# Patient Record
Sex: Male | Born: 1965 | Race: Black or African American | Hispanic: No | Marital: Married | Smoking: Never smoker
Health system: Southern US, Community
[De-identification: ages and names within clinical notes are randomized; demographics above are authoritative.]

## PROBLEM LIST (undated history)

## (undated) DIAGNOSIS — I10 Essential (primary) hypertension: Secondary | ICD-10-CM

## (undated) DIAGNOSIS — E119 Type 2 diabetes mellitus without complications: Secondary | ICD-10-CM

## (undated) DIAGNOSIS — E7800 Pure hypercholesterolemia, unspecified: Secondary | ICD-10-CM

---

## 2012-07-21 ENCOUNTER — Emergency Department (HOSPITAL_BASED_OUTPATIENT_CLINIC_OR_DEPARTMENT_OTHER)
Admission: EM | Admit: 2012-07-21 | Discharge: 2012-07-21 | Disposition: A | Discharge status: Home / Self Care | Payer: Self-pay | Attending: Emergency Medicine | Admitting: Emergency Medicine

## 2012-07-21 ENCOUNTER — Encounter (HOSPITAL_BASED_OUTPATIENT_CLINIC_OR_DEPARTMENT_OTHER): Payer: Self-pay | Admitting: Family Medicine

## 2012-07-21 ENCOUNTER — Emergency Department (HOSPITAL_BASED_OUTPATIENT_CLINIC_OR_DEPARTMENT_OTHER): Payer: Self-pay

## 2012-07-21 DIAGNOSIS — R0789 Other chest pain: Secondary | ICD-10-CM | POA: Insufficient documentation

## 2012-07-21 DIAGNOSIS — M549 Dorsalgia, unspecified: Secondary | ICD-10-CM | POA: Insufficient documentation

## 2012-07-21 DIAGNOSIS — E1169 Type 2 diabetes mellitus with other specified complication: Secondary | ICD-10-CM | POA: Insufficient documentation

## 2012-07-21 DIAGNOSIS — M25519 Pain in unspecified shoulder: Secondary | ICD-10-CM | POA: Insufficient documentation

## 2012-07-21 DIAGNOSIS — R079 Chest pain, unspecified: Secondary | ICD-10-CM

## 2012-07-21 DIAGNOSIS — R739 Hyperglycemia, unspecified: Secondary | ICD-10-CM

## 2012-07-21 DIAGNOSIS — I10 Essential (primary) hypertension: Secondary | ICD-10-CM | POA: Insufficient documentation

## 2012-07-21 DIAGNOSIS — E7800 Pure hypercholesterolemia, unspecified: Secondary | ICD-10-CM | POA: Insufficient documentation

## 2012-07-21 HISTORY — DX: Pure hypercholesterolemia, unspecified: E78.00

## 2012-07-21 HISTORY — DX: Type 2 diabetes mellitus without complications: E11.9

## 2012-07-21 HISTORY — DX: Essential (primary) hypertension: I10

## 2012-07-21 LAB — CBC WITH DIFFERENTIAL/PLATELET
Basophils Relative: 0 % (ref 0–1)
Eosinophils Absolute: 0.1 10*3/uL (ref 0.0–0.7)
HCT: 41.4 % (ref 39.0–52.0)
Hemoglobin: 14.6 g/dL (ref 13.0–17.0)
Lymphs Abs: 1.6 10*3/uL (ref 0.7–4.0)
MCH: 31.3 pg (ref 26.0–34.0)
MCHC: 35.3 g/dL (ref 30.0–36.0)
Monocytes Absolute: 0.5 10*3/uL (ref 0.1–1.0)
Monocytes Relative: 8 % (ref 3–12)
Neutro Abs: 4 10*3/uL (ref 1.7–7.7)

## 2012-07-21 LAB — BASIC METABOLIC PANEL
BUN: 12 mg/dL (ref 6–23)
Chloride: 100 mEq/L (ref 96–112)
Creatinine, Ser: 1.1 mg/dL (ref 0.50–1.35)
GFR calc Af Amer: >90 mL/min (ref >90)
Glucose, Bld: 190 mg/dL — ABNORMAL HIGH (ref 70–99)

## 2012-07-21 LAB — TROPONIN I: Troponin I: <0.3 ng/mL (ref <0.30)

## 2012-07-21 MED ORDER — AMLODIPINE BESYLATE 10 MG PO TABS: 5.0000 mg | ORAL_TABLET | Freq: Every day | ORAL | Status: DC

## 2012-07-21 MED ORDER — LISINOPRIL 40 MG PO TABS: 40.0000 mg | ORAL_TABLET | Freq: Every day | ORAL | Status: DC

## 2012-07-21 MED ORDER — METFORMIN HCL 500 MG PO TABS: 500.0000 mg | ORAL_TABLET | Freq: Two times a day (BID) | ORAL | Status: DC

## 2012-07-21 NOTE — ED Notes (Signed)
Pt c/o left chest and left upper back pain x 6 days. Pt diaphoretic upon arrival.

## 2012-07-21 NOTE — ED Provider Notes (Signed)
History     CSN: [id number]  Arrival date & time 07/21/12  1013   First MD Initiated Contact with Patient 07/21/12 1046      Chief Complaint  Patient presents with  . Chest Pain    (Consider location/radiation/quality/duration/timing/severity/associated sxs/prior treatment) HPI Pt reports for the last 6 days he has had aching pain in L shoulder and upper chest, worse with moving arm and lifting things. He has also had an occasional aching pain in L upper back, worse with cough and deep breath. No SOB, pain comes and goes. No prior history of same. He has history of HTN, DM and stopped taking his medications when he ran out a few months ago. Has not been able to get back in to see his doctor due to financial issues.   Past Medical History  Diagnosis Date  . Hypertension   . Diabetes mellitus without complication   . High cholesterol     History reviewed. No pertinent past surgical history.  No family history on file.  History  Substance Use Topics  . Smoking status: Never Smoker   . Smokeless tobacco: Not on file  . Alcohol Use: Yes      Review of Systems All other systems reviewed and are negative except as noted in HPI.   Allergies  Review of patient's allergies indicates no known allergies.  Home Medications  No current outpatient prescriptions on file.  BP 204/112  Pulse 74  Temp 98.4 F (36.9 C) (Oral)  Resp 16  Ht 5\' 6"  (1.676 m)  Wt 188 lb (85.276 kg)  BMI 30.34 kg/m2  SpO2 100%  Physical Exam  Nursing note and vitals reviewed. Constitutional: He is oriented to person, place, and time. He appears well-developed and well-nourished.  HENT:  Head: Normocephalic and atraumatic.  Eyes: EOM are normal. Pupils are equal, round, and reactive to light.  Neck: Normal range of motion. Neck supple.  Cardiovascular: Normal rate, normal heart sounds and intact distal pulses.   Pulmonary/Chest: Effort normal and breath sounds normal. He exhibits tenderness.    Abdominal: Bowel sounds are normal. He exhibits no distension. There is no tenderness.  Musculoskeletal: Normal range of motion. He exhibits no edema and no tenderness.       Arms: Neurological: He is alert and oriented to person, place, and time. He has normal strength. No cranial nerve deficit or sensory deficit.  Skin: Skin is warm and dry. No rash noted.  Psychiatric: He has a normal mood and affect.    ED Course  Procedures (including critical care time)  Labs Reviewed  BASIC METABOLIC PANEL - Abnormal; Notable for the following:    Glucose, Bld 190 (*)     GFR calc non Af Amer 79 (*)     All other components within normal limits  CK TOTAL AND CKMB - Abnormal; Notable for the following:    Total CK 307 (*)     CK, MB 4.3 (*)     All other components within normal limits  CBC WITH DIFFERENTIAL  TROPONIN I   Dg Chest 2 View  07/21/2012  *RADIOLOGY REPORT*  Clinical Data: Left-sided chest pain.  Hypertension and diabetes.  CHEST - 2 VIEW  Comparison: None.  Findings: Artifact overlies chest.  Heart size is normal but with left ventricular prominence.  Mediastinal shadows are normal. There are areas of atelectasis in both lower lungs, left worse than right.  No effusions.  No bony abnormalities.  IMPRESSION: Areas of atelectasis in  both lower lungs, left worse than right.   Original Report Authenticated By: Paulina Fusi, M.D.      No diagnosis found.    MDM   Date: 07/21/2012  Rate: 78  Rhythm: normal sinus rhythm  QRS Axis: normal  Intervals: normal  ST/T Wave abnormalities: normal  Conduction Disutrbances: none  Narrative Interpretation: unremarkable  Labs and imaging unremarkable as above. Symptoms are musculoskeletal, mild hyperglycemia and marked HTN. Will restart 2 BP meds now as patient states he has lost some weight recently, was previously taking 4 different meds for HTN. Was also previously on insulin but glucose here is under 200 so will use Metformin as this is  less expensive. Advised PCP followup for recheck. No concern for ACS/MI/Dissection or PE.          Charles B. Bernette Mayers, MD 07/21/12 1225

## 2013-05-24 IMAGING — CR DG CHEST 2V
2 series · 2 of 2 positions shown · non-contrast
Comparison: None.

CLINICAL DATA: Left-sided chest pain.  Hypertension and diabetes.

CHEST - 2 VIEW

[w chest pa]
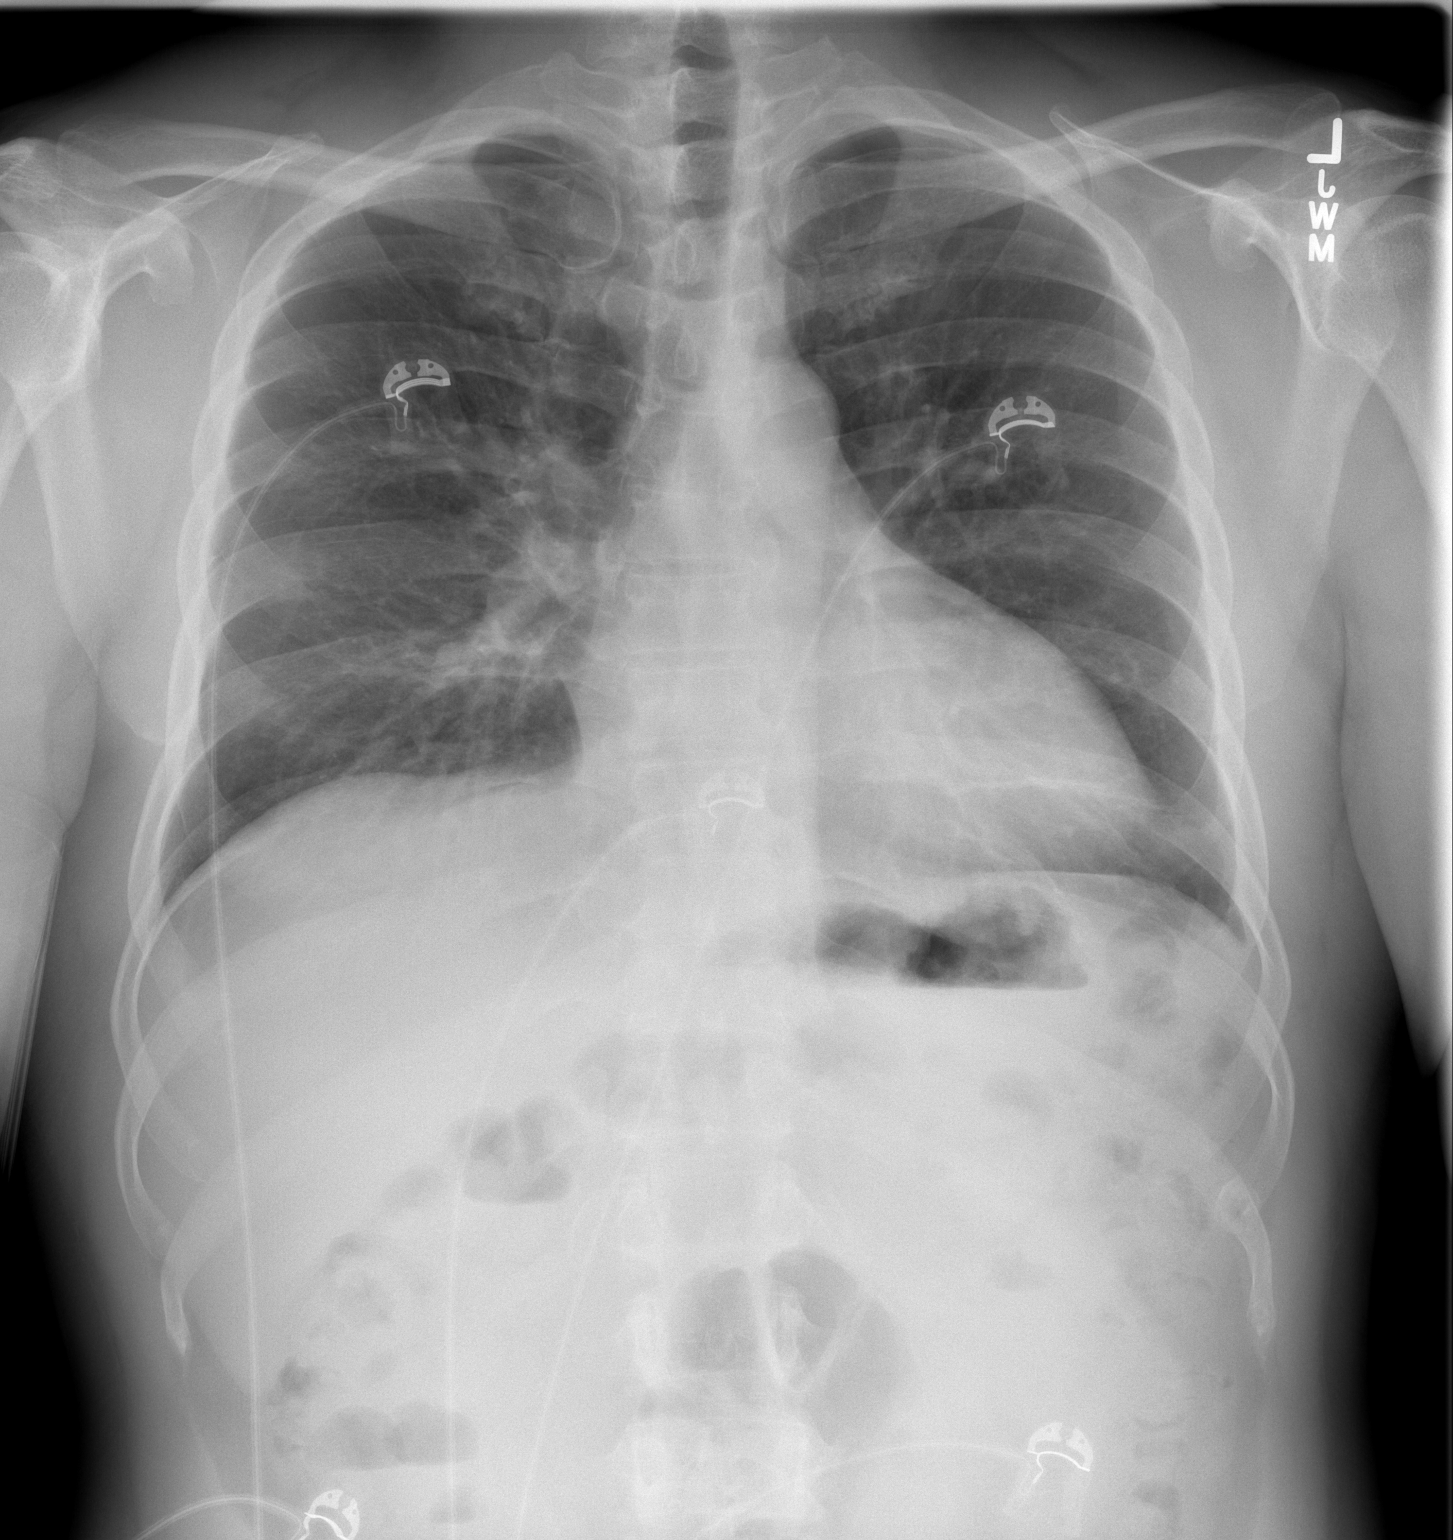

[w chest lat]
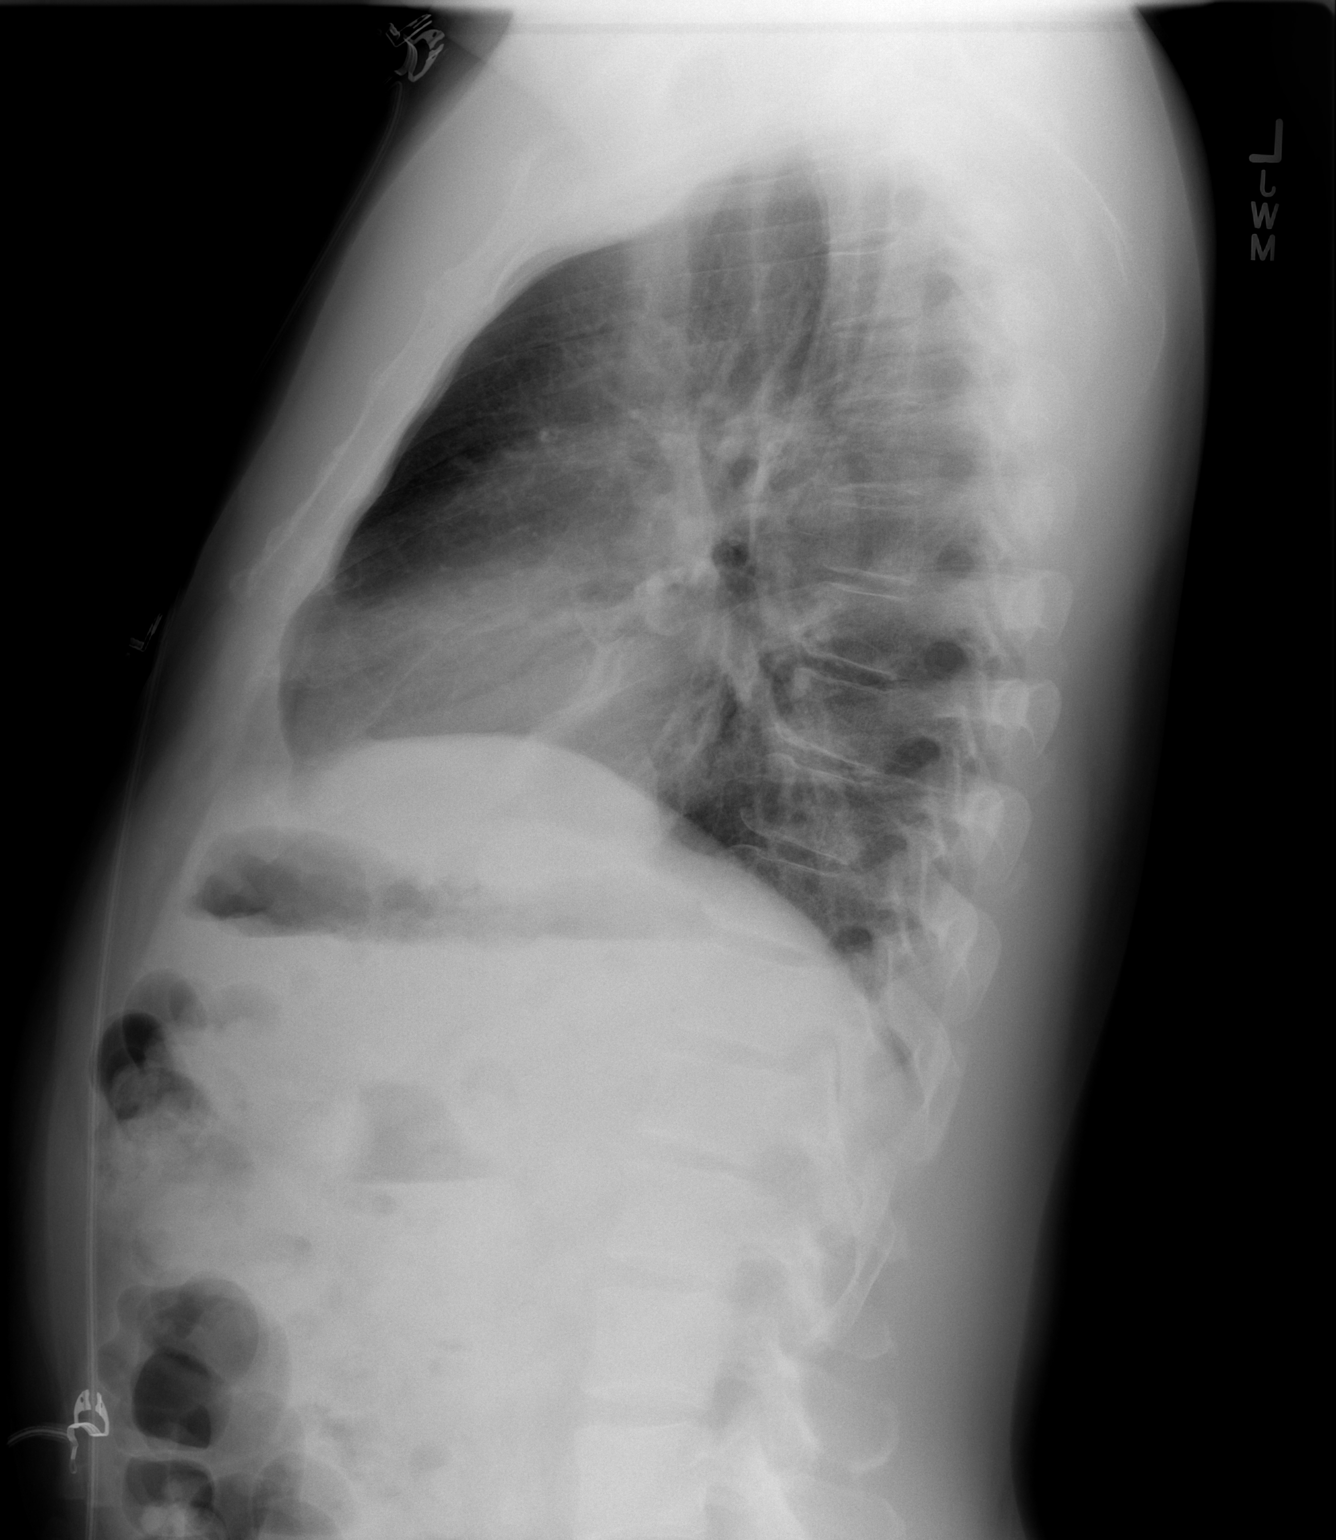

[2 of 2 positions shown; findings below may reference images not displayed]

FINDINGS: Artifact overlies chest.  Heart size is normal but with
left ventricular prominence.  Mediastinal shadows are normal.
There are areas of atelectasis in both lower lungs, left worse than
right.  No effusions.  No bony abnormalities.
IMPRESSION: Areas of atelectasis in both lower lungs, left worse than right.

## 2023-01-09 ENCOUNTER — Emergency Department (HOSPITAL_BASED_OUTPATIENT_CLINIC_OR_DEPARTMENT_OTHER)
Admission: EM | Admit: 2023-01-09 | Discharge: 2023-01-09 | Disposition: A | Discharge status: Home / Self Care | Payer: No Typology Code available for payment source | Attending: Emergency Medicine | Admitting: Emergency Medicine

## 2023-01-09 ENCOUNTER — Encounter (HOSPITAL_BASED_OUTPATIENT_CLINIC_OR_DEPARTMENT_OTHER): Payer: Self-pay | Admitting: Emergency Medicine

## 2023-01-09 ENCOUNTER — Other Ambulatory Visit: Payer: Self-pay

## 2023-01-09 DIAGNOSIS — I10 Essential (primary) hypertension: Secondary | ICD-10-CM | POA: Diagnosis not present

## 2023-01-09 DIAGNOSIS — Z79899 Other long term (current) drug therapy: Secondary | ICD-10-CM | POA: Diagnosis not present

## 2023-01-09 DIAGNOSIS — R739 Hyperglycemia, unspecified: Secondary | ICD-10-CM

## 2023-01-09 DIAGNOSIS — E1165 Type 2 diabetes mellitus with hyperglycemia: Secondary | ICD-10-CM | POA: Insufficient documentation

## 2023-01-09 DIAGNOSIS — R110 Nausea: Secondary | ICD-10-CM | POA: Insufficient documentation

## 2023-01-09 DIAGNOSIS — Z7984 Long term (current) use of oral hypoglycemic drugs: Secondary | ICD-10-CM | POA: Insufficient documentation

## 2023-01-09 LAB — CBC WITH DIFFERENTIAL/PLATELET
Abs Immature Granulocytes: 0.02 10*3/uL (ref 0.00–0.07)
Basophils Absolute: 0 10*3/uL (ref 0.0–0.1)
Basophils Relative: 0 %
Eosinophils Absolute: 0 10*3/uL (ref 0.0–0.5)
Eosinophils Relative: 0 %
HCT: 43 % (ref 39.0–52.0)
Hemoglobin: 15.4 g/dL (ref 13.0–17.0)
Immature Granulocytes: 0 %
Lymphocytes Relative: 22 %
Lymphs Abs: 1.5 10*3/uL (ref 0.7–4.0)
MCH: 32.1 pg (ref 26.0–34.0)
MCHC: 35.8 g/dL (ref 30.0–36.0)
MCV: 89.6 fL (ref 80.0–100.0)
Monocytes Absolute: 0.3 10*3/uL (ref 0.1–1.0)
Monocytes Relative: 5 %
Neutro Abs: 4.8 10*3/uL (ref 1.7–7.7)
Neutrophils Relative %: 73 %
Platelets: 378 10*3/uL (ref 150–400)
RBC: 4.8 MIL/uL (ref 4.22–5.81)
RDW: 11.5 % (ref 11.5–15.5)
WBC: 6.7 10*3/uL (ref 4.0–10.5)
nRBC: 0 % (ref 0.0–0.2)

## 2023-01-09 LAB — LIPASE, BLOOD: Lipase: 76 U/L — ABNORMAL HIGH (ref 11–51)

## 2023-01-09 LAB — CBG MONITORING, ED
Glucose-Capillary: 389 mg/dL — ABNORMAL HIGH (ref 70–99)
Glucose-Capillary: 162 mg/dL — ABNORMAL HIGH (ref 70–99)

## 2023-01-09 LAB — URINALYSIS, ROUTINE W REFLEX MICROSCOPIC
Bilirubin Urine: NEGATIVE
Glucose, UA: >=500 mg/dL — AB
Hgb urine dipstick: NEGATIVE
Ketones, ur: NEGATIVE mg/dL
Leukocytes,Ua: NEGATIVE
Nitrite: NEGATIVE
Protein, ur: 30 mg/dL — AB
Specific Gravity, Urine: 1.015 (ref 1.005–1.030)
pH: 5.5 (ref 5.0–8.0)

## 2023-01-09 LAB — COMPREHENSIVE METABOLIC PANEL
ALT: 20 U/L (ref 0–44)
AST: 17 U/L (ref 15–41)
Albumin: 3.8 g/dL (ref 3.5–5.0)
Alkaline Phosphatase: 78 U/L (ref 38–126)
Anion gap: 8 (ref 5–15)
BUN: 12 mg/dL (ref 6–20)
CO2: 24 mmol/L (ref 22–32)
Calcium: 9 mg/dL (ref 8.9–10.3)
Chloride: 102 mmol/L (ref 98–111)
Creatinine, Ser: 0.99 mg/dL (ref 0.61–1.24)
GFR, Estimated: >60 mL/min (ref >60)
Glucose, Bld: 371 mg/dL — ABNORMAL HIGH (ref 70–99)
Potassium: 4 mmol/L (ref 3.5–5.1)
Sodium: 134 mmol/L — ABNORMAL LOW (ref 135–145)
Total Bilirubin: 0.9 mg/dL (ref 0.3–1.2)
Total Protein: 7.7 g/dL (ref 6.5–8.1)

## 2023-01-09 LAB — URINALYSIS, MICROSCOPIC (REFLEX): RBC / HPF: NONE SEEN RBC/hpf (ref 0–5)

## 2023-01-09 MED ORDER — DIPHENHYDRAMINE HCL 50 MG/ML IJ SOLN
12.5000 mg | Freq: Once | INTRAMUSCULAR | Status: AC
  Administered 2023-01-09: 12.5 mg via INTRAVENOUS
  Filled 2023-01-09: qty 1

## 2023-01-09 MED ORDER — DROPERIDOL 2.5 MG/ML IJ SOLN
1.2500 mg | Freq: Once | INTRAMUSCULAR | Status: AC
  Administered 2023-01-09: 1.25 mg via INTRAVENOUS
  Filled 2023-01-09: qty 2

## 2023-01-09 MED ORDER — LACTATED RINGERS IV BOLUS
1000.0000 mL | Freq: Once | INTRAVENOUS | Status: AC
  Administered 2023-01-09: 1000 mL via INTRAVENOUS

## 2023-01-09 MED ORDER — INSULIN ASPART 100 UNIT/ML IJ SOLN
4.0000 [IU] | INTRAMUSCULAR | Status: AC
  Administered 2023-01-09: 4 [IU] via SUBCUTANEOUS

## 2023-01-09 MED ORDER — ONDANSETRON 4 MG PO TBDP: 4.0000 mg | ORAL_TABLET | Freq: Three times a day (TID) | ORAL | 0 refills | Status: DC | PRN

## 2023-01-09 NOTE — Discharge Instructions (Addendum)
You were seen for your nausea and vomiting in the emergency department.   At home, please take the Zofran we have prescribed you for any nausea or vomiting.  Please also refrain from smoking marijuana since this can make your nausea worse.    Follow-up with your primary doctor in 2-3 days regarding your visit.    Return immediately to the emergency department if you experience any of the following: Shortness of breath, severely elevated blood sugars, severe abdominal pain, or any other concerning symptoms.    Thank you for visiting our Emergency Department. It was a pleasure taking care of you today.

## 2023-01-09 NOTE — ED Notes (Signed)
Pt drinking water, friend in room

## 2023-01-09 NOTE — ED Notes (Signed)
Discharge instructions reviewed with patient. Patient verbalizes understanding, no further questions at this time. Medications/prescriptions and follow up information provided. No acute distress noted at time of departure.  

## 2023-01-09 NOTE — ED Triage Notes (Signed)
Pt reports nausea and fatigue; reports he has been w/o all meds (DM, HTN) x 2 wks, not checking glucose

## 2023-01-09 NOTE — ED Provider Notes (Signed)
[place] [facility] Provider Note   CSN: [id number] Arrival date & time: 01/09/23  1123     History  Chief Complaint  Patient presents with   Nausea    Tyler Wallace is a 57 y.o. male.  57 year old male with a history of diabetes, hypertension, and hyperlipidemia who presents to the emergency department with nausea.  Patient reports that he was out of his medications for 2 weeks and then resumed his Ozempic, metformin, lisinopril, amlodipine, and atorvastatin on Monday.  Says that shortly afterwards he started becoming nauseous and had 1 episode of nonbloody nonbilious emesis in the ED waiting room.  Says his blood sugars have also been running high and has had increased urination.  No increased thirst or shortness of breath.  Denies any abdominal pain.  Says he felt too sick to go to work so came to the emergency department.  Does smoke marijuana daily.  Denies any significant alcohol use.       Home Medications Prior to Admission medications   Medication Sig Start Date End Date Taking? Authorizing Provider  ondansetron (ZOFRAN-ODT) 4 MG disintegrating tablet Take 1 tablet (4 mg total) by mouth every 8 (eight) hours as needed for nausea or vomiting. 01/09/23  Yes Rondel Baton, MD  amLODipine (NORVASC) 10 MG tablet Take 0.5 tablets (5 mg total) by mouth daily. 07/21/12   Pollyann Savoy, MD  lisinopril (PRINIVIL,ZESTRIL) 40 MG tablet Take 1 tablet (40 mg total) by mouth daily. 07/21/12   Pollyann Savoy, MD  metFORMIN (GLUCOPHAGE) 500 MG tablet Take 1 tablet (500 mg total) by mouth 2 (two) times daily with a meal. 07/21/12   Pollyann Savoy, MD      Allergies    Patient has no known allergies.    Review of Systems   Review of Systems  Physical Exam Updated Vital Signs BP (!) 175/109   Pulse 90   Temp 98.1 F (36.7 C) (Oral)   Resp (!) 23   Ht  (1.676 m)   Wt 69.9 kg   SpO2 99%   BMI 24.86 kg/m  Physical  Exam Vitals and nursing note reviewed.  Constitutional:      General: He is not in acute distress.    Appearance: He is well-developed.  HENT:     Head: Normocephalic and atraumatic.     Right Ear: External ear normal.     Left Ear: External ear normal.     Nose: Nose normal.  Eyes:     Extraocular Movements: Extraocular movements intact.     Conjunctiva/sclera: Conjunctivae normal.     Pupils: Pupils are equal, round, and reactive to light.  Cardiovascular:     Rate and Rhythm: Normal rate and regular rhythm.  Pulmonary:     Effort: Pulmonary effort is normal. No respiratory distress.  Abdominal:     General: There is no distension.     Palpations: Abdomen is soft. There is no mass.     Tenderness: There is no abdominal tenderness. There is no guarding.  Musculoskeletal:     Cervical back: Normal range of motion and neck supple.  Skin:    General: Skin is warm and dry.  Neurological:     Mental Status: He is alert. Mental status is at baseline.  Psychiatric:        Mood and Affect: Mood normal.        Behavior: Behavior normal.     ED Results /  Procedures / Treatments   Labs (all labs ordered are listed, but only abnormal results are displayed) Labs Reviewed  COMPREHENSIVE METABOLIC PANEL - Abnormal; Notable for the following components:      Result Value   Sodium 134 (*)    Glucose, Bld 371 (*)    All other components within normal limits  URINALYSIS, ROUTINE W REFLEX MICROSCOPIC - Abnormal; Notable for the following components:   Glucose, UA >=500 (*)    Protein, ur 30 (*)    All other components within normal limits  URINALYSIS, MICROSCOPIC (REFLEX) - Abnormal; Notable for the following components:   Bacteria, UA RARE (*)    All other components within normal limits  LIPASE, BLOOD - Abnormal; Notable for the following components:   Lipase 76 (*)    All other components within normal limits  CBG MONITORING, ED - Abnormal; Notable for the following components:    Glucose-Capillary 389 (*)    All other components within normal limits  CBG MONITORING, ED - Abnormal; Notable for the following components:   Glucose-Capillary 162 (*)    All other components within normal limits  CBC WITH DIFFERENTIAL/PLATELET    EKG EKG Interpretation  Date/Time:  Wednesday January 09 2023 13:35:04 EDT Ventricular Rate:  79 PR Interval:  151 QRS Duration: 75 QT Interval:  328 QTC Calculation: 376 R Axis:   76 Text Interpretation: Sinus rhythm Confirmed by Vonita Moss [phone]) on 01/09/2023 2:06:39 PM  Radiology No results found.  Procedures Procedures   Medications Ordered in ED Medications  lactated ringers bolus 1,000 mL (0 mLs Intravenous Stopped 01/09/23 1531)  droperidol (INAPSINE) 2.5 MG/ML injection 1.25 mg (1.25 mg Intravenous Given 01/09/23 1419)  diphenhydrAMINE (BENADRYL) injection 12.5 mg (12.5 mg Intravenous Given 01/09/23 1329)  insulin aspart (novoLOG) injection 4 Units (4 Units Subcutaneous Given 01/09/23 1336)    ED Course/ Medical Decision Making/ A&P                             Medical Decision Making Amount and/or Complexity of Data Reviewed Labs: ordered.  Risk Prescription drug management.   Tyler Wallace is a 57 y.o. male with comorbidities that complicate the patient evaluation including diabetes on Ozempic and metformin, hypertension, hyperlipidemia, and frequent marijuana use who presents emergency department with nausea and elevated blood sugars  Initial Ddx:  DKA, HHS, hyperglycemia, cannabinoid induced hyperemesis, medication side effect  MDM:  The patient likely has hyperglycemia given his elevated blood sugars but lack of increased thirst or frequency and the fact that he has type 2 diabetes.  Does not appear altered that would be consistent with HHS and point-of-care glucose was lower than would be expected for this disease.  Nausea and vomiting could be due to medication side effects with him starting his Ozempic  and metformin around the time of his symptoms started.  Also does smoke marijuana daily which could be related to his symptoms as well.  Plan:  Labs Urinalysis IV fluids Insulin Droperidol  ED Summary/Re-evaluation:  Patient was feeling much improved after the droperidol and was able to tolerate p.o.  Blood sugar improved to 162 with insulin and bolus of fluids.  No signs of DKA based on his lab work.  Patient was then discharged home with instructions to talk to his primary doctor about the possibility of a medication side effect and to stop smoking marijuana as it could be related to his symptoms.  Given a work  note prior to discharge.  This patient presents to the ED for concern of complaints listed in HPI, this involves an extensive number of treatment options, and is a complaint that carries with it a high risk of complications and morbidity. Disposition including potential need for admission considered.   Dispo: DC Home. Return precautions discussed including, but not limited to, those listed in the AVS. Allowed pt time to ask questions which were answered fully prior to dc.  Records reviewed Outpatient Clinic Notes The following labs were independently interpreted: Chemistry and show  hyperglycemia without DKA I personally reviewed and interpreted cardiac monitoring: normal sinus rhythm  I personally reviewed and interpreted the pt's EKG: see above for interpretation  I have reviewed the patients home medications and made adjustments as needed  Final Clinical Impression(s) / ED Diagnoses Final diagnoses:  Nausea  Hyperglycemia    Rx / DC Orders ED Discharge Orders          Ordered    ondansetron (ZOFRAN-ODT) 4 MG disintegrating tablet  Every 8 hours PRN        01/09/23 1551              Rondel Baton, MD 01/09/23 1615

## 2024-05-11 ENCOUNTER — Emergency Department (HOSPITAL_BASED_OUTPATIENT_CLINIC_OR_DEPARTMENT_OTHER): Admission: EM | Admit: 2024-05-11 | Discharge: 2024-05-11 | Disposition: A | Discharge status: Home / Self Care

## 2024-05-11 ENCOUNTER — Encounter (HOSPITAL_BASED_OUTPATIENT_CLINIC_OR_DEPARTMENT_OTHER): Payer: Self-pay | Admitting: Emergency Medicine

## 2024-05-11 ENCOUNTER — Other Ambulatory Visit: Payer: Self-pay

## 2024-05-11 ENCOUNTER — Emergency Department (HOSPITAL_BASED_OUTPATIENT_CLINIC_OR_DEPARTMENT_OTHER)

## 2024-05-11 DIAGNOSIS — E1165 Type 2 diabetes mellitus with hyperglycemia: Secondary | ICD-10-CM | POA: Insufficient documentation

## 2024-05-11 DIAGNOSIS — R739 Hyperglycemia, unspecified: Secondary | ICD-10-CM | POA: Diagnosis present

## 2024-05-11 DIAGNOSIS — E871 Hypo-osmolality and hyponatremia: Secondary | ICD-10-CM | POA: Insufficient documentation

## 2024-05-11 DIAGNOSIS — Z7984 Long term (current) use of oral hypoglycemic drugs: Secondary | ICD-10-CM | POA: Diagnosis not present

## 2024-05-11 DIAGNOSIS — E876 Hypokalemia: Secondary | ICD-10-CM | POA: Insufficient documentation

## 2024-05-11 LAB — CBC
HCT: 41.3 % (ref 39.0–52.0)
Hemoglobin: 14.8 g/dL (ref 13.0–17.0)
MCH: 32 pg (ref 26.0–34.0)
MCHC: 35.8 g/dL (ref 30.0–36.0)
MCV: 89.4 fL (ref 80.0–100.0)
Platelets: 370 K/uL (ref 150–400)
RBC: 4.62 MIL/uL (ref 4.22–5.81)
RDW: 11.2 % — ABNORMAL LOW (ref 11.5–15.5)
WBC: 7.9 K/uL (ref 4.0–10.5)
nRBC: 0 % (ref 0.0–0.2)

## 2024-05-11 LAB — URINALYSIS, ROUTINE W REFLEX MICROSCOPIC
Bilirubin Urine: NEGATIVE
Glucose, UA: >=500 mg/dL — AB
Hgb urine dipstick: NEGATIVE
Ketones, ur: NEGATIVE mg/dL
Leukocytes,Ua: NEGATIVE
Nitrite: NEGATIVE
Protein, ur: NEGATIVE mg/dL
Specific Gravity, Urine: <=1.005 (ref 1.005–1.030)
pH: 5.5 (ref 5.0–8.0)

## 2024-05-11 LAB — I-STAT VENOUS BLOOD GAS, ED
Acid-base deficit: 3 mmol/L — ABNORMAL HIGH (ref 0.0–2.0)
Bicarbonate: 23.3 mmol/L (ref 20.0–28.0)
Calcium, Ion: 1.24 mmol/L (ref 1.15–1.40)
HCT: 44 % (ref 39.0–52.0)
Hemoglobin: 15 g/dL (ref 13.0–17.0)
O2 Saturation: 77 %
Potassium: 5.1 mmol/L (ref 3.5–5.1)
Sodium: 131 mmol/L — ABNORMAL LOW (ref 135–145)
TCO2: 25 mmol/L (ref 22–32)
pCO2, Ven: 42.8 mmHg — ABNORMAL LOW (ref 44–60)
pH, Ven: 7.343 (ref 7.25–7.43)
pO2, Ven: 44 mmHg (ref 32–45)

## 2024-05-11 LAB — COMPREHENSIVE METABOLIC PANEL WITH GFR
ALT: 19 U/L (ref 0–44)
AST: 14 U/L — ABNORMAL LOW (ref 15–41)
Albumin: 4.4 g/dL (ref 3.5–5.0)
Alkaline Phosphatase: 104 U/L (ref 38–126)
Anion gap: 15 (ref 5–15)
BUN: 23 mg/dL — ABNORMAL HIGH (ref 6–20)
CO2: 21 mmol/L — ABNORMAL LOW (ref 22–32)
Calcium: 9.9 mg/dL (ref 8.9–10.3)
Chloride: 94 mmol/L — ABNORMAL LOW (ref 98–111)
Creatinine, Ser: 1.44 mg/dL — ABNORMAL HIGH (ref 0.61–1.24)
GFR, Estimated: 57 mL/min — ABNORMAL LOW (ref >60)
Glucose, Bld: 692 mg/dL (ref 70–99)
Potassium: 5.3 mmol/L — ABNORMAL HIGH (ref 3.5–5.1)
Sodium: 130 mmol/L — ABNORMAL LOW (ref 135–145)
Total Bilirubin: 0.5 mg/dL (ref 0.0–1.2)
Total Protein: 7.6 g/dL (ref 6.5–8.1)

## 2024-05-11 LAB — CBG MONITORING, ED
Glucose-Capillary: >600 mg/dL (ref 70–99)
Glucose-Capillary: 189 mg/dL — ABNORMAL HIGH (ref 70–99)

## 2024-05-11 LAB — BASIC METABOLIC PANEL WITH GFR
Anion gap: 12 (ref 5–15)
BUN: 17 mg/dL (ref 6–20)
CO2: 22 mmol/L (ref 22–32)
Calcium: 9.7 mg/dL (ref 8.9–10.3)
Chloride: 103 mmol/L (ref 98–111)
Creatinine, Ser: 1.03 mg/dL (ref 0.61–1.24)
GFR, Estimated: >60 mL/min (ref >60)
Glucose, Bld: 232 mg/dL — ABNORMAL HIGH (ref 70–99)
Potassium: 4.4 mmol/L (ref 3.5–5.1)
Sodium: 137 mmol/L (ref 135–145)

## 2024-05-11 LAB — URINALYSIS, MICROSCOPIC (REFLEX)
Bacteria, UA: NONE SEEN
RBC / HPF: NONE SEEN RBC/hpf (ref 0–5)
Squamous Epithelial / HPF: NONE SEEN /HPF (ref 0–5)

## 2024-05-11 LAB — BETA-HYDROXYBUTYRIC ACID: Beta-Hydroxybutyric Acid: 0.79 mmol/L — ABNORMAL HIGH (ref 0.05–0.27)

## 2024-05-11 LAB — LIPASE, BLOOD: Lipase: 141 U/L — ABNORMAL HIGH (ref 11–51)

## 2024-05-11 MED ORDER — IOHEXOL 300 MG/ML  SOLN
80.0000 mL | Freq: Once | INTRAMUSCULAR | Status: AC | PRN
Start: 2024-05-11 — End: 2024-05-11
  Administered 2024-05-11: 80 mL via INTRAVENOUS

## 2024-05-11 MED ORDER — LACTATED RINGERS IV BOLUS
1000.0000 mL | Freq: Once | INTRAVENOUS | Status: AC
  Administered 2024-05-11: 1000 mL via INTRAVENOUS

## 2024-05-11 MED ORDER — INSULIN REGULAR HUMAN 100 UNIT/ML IJ SOLN: 10.0000 [IU] | Freq: Once | INTRAMUSCULAR | Status: DC

## 2024-05-11 MED ORDER — INSULIN ASPART 100 UNIT/ML IJ SOLN
10.0000 [IU] | Freq: Once | INTRAMUSCULAR | Status: AC
  Administered 2024-05-11: 10 [IU] via SUBCUTANEOUS

## 2024-05-11 NOTE — ED Notes (Signed)
 RN provided AVS using Teachback Method. Patient verbalizes understanding of Discharge Instructions. Opportunity for Questioning and Answers were provided by RN. Patient Discharged from ED ambulatory to home via self.

## 2024-05-11 NOTE — ED Notes (Signed)
 Patient transported to radiology

## 2024-05-11 NOTE — Discharge Instructions (Signed)
 As discussed please follow-up with your primary doctor.  Please take your diabetes medication as prescribed to you.  Additionally, you will need to follow-up with your primary doctor regarding further evaluation of the abnormalities of your pancreas found on CT scan.  You may need an MRCP.  Return to fevers, chills, chest pain, shortness of breath, worsening abdominal pain, nausea or vomiting, or any new or worsening symptoms that are concerning to you

## 2024-05-11 NOTE — ED Provider Notes (Signed)
 [place] [facility] Provider Note   CSN: [id number] Arrival date & time: 05/11/24  1046     Patient presents with: Dizziness   Tyler Wallace is a 58 y.o. male.  {Add pertinent medical, surgical, social history, OB history to HPI:2820} 58 year old male history of diabetes presented to the emergency department for hyperglycemia.  Has not taken his Ozempic or his metformin  for several weeks has he had issues getting refill, he has them and took them this morning.  Has been having increased thirst and urination for the past several days.  Became nauseated this morning.  Not having abdominal pain.   Dizziness      Prior to Admission medications   Medication Sig Start Date End Date Taking? Authorizing Provider  amLODipine  (NORVASC ) 10 MG tablet Take 0.5 tablets (5 mg total) by mouth daily. 07/21/12   Roselyn Carlin NOVAK, MD  lisinopril  (PRINIVIL ,ZESTRIL ) 40 MG tablet Take 1 tablet (40 mg total) by mouth daily. 07/21/12   Roselyn Carlin NOVAK, MD  metFORMIN  (GLUCOPHAGE ) 500 MG tablet Take 1 tablet (500 mg total) by mouth 2 (two) times daily with a meal. 07/21/12   Roselyn Carlin NOVAK, MD  ondansetron  (ZOFRAN -ODT) 4 MG disintegrating tablet Take 1 tablet (4 mg total) by mouth every 8 (eight) hours as needed for nausea or vomiting. 01/09/23   Yolande Lamar BROCKS, MD    Allergies: Patient has no known allergies.    Review of Systems  Neurological:  Positive for dizziness.    Updated Vital Signs BP (!) 163/84 (BP Location: Right Arm)   Pulse 79   Temp 98.8 F (37.1 C) (Oral)   Resp 18   Ht 5' 6 (1.676 m)   Wt 68 kg   SpO2 99%   BMI 24.21 kg/m   Physical Exam Vitals and nursing note reviewed.  Constitutional:      General: He is not in acute distress.    Appearance: He is not toxic-appearing.  HENT:     Mouth/Throat:     Mouth: Mucous membranes are moist.  Eyes:     Conjunctiva/sclera: Conjunctivae normal.  Cardiovascular:     Rate and  Rhythm: Normal rate and regular rhythm.  Pulmonary:     Effort: Pulmonary effort is normal.     Breath sounds: Normal breath sounds.  Abdominal:     General: Abdomen is flat. There is no distension.     Tenderness: There is no abdominal tenderness. There is no guarding or rebound.  Musculoskeletal:        General: Normal range of motion.  Skin:    General: Skin is warm and dry.     Capillary Refill: Capillary refill takes less than 2 seconds.  Neurological:     Mental Status: He is alert and oriented to person, place, and time.  Psychiatric:        Mood and Affect: Mood normal.        Behavior: Behavior normal.     (all labs ordered are listed, but only abnormal results are displayed) Labs Reviewed  COMPREHENSIVE METABOLIC PANEL WITH GFR - Abnormal; Notable for the following components:      Result Value   Sodium 130 (*)    Potassium 5.3 (*)    Chloride 94 (*)    CO2 21 (*)    Glucose, Bld 692 (*)    BUN 23 (*)    Creatinine, Ser 1.44 (*)    AST 14 (*)    GFR, Estimated  57 (*)    All other components within normal limits  CBC - Abnormal; Notable for the following components:   RDW 11.2 (*)    All other components within normal limits  URINALYSIS, ROUTINE W REFLEX MICROSCOPIC - Abnormal; Notable for the following components:   Glucose, UA >=500 (*)    All other components within normal limits  BETA-HYDROXYBUTYRIC ACID - Abnormal; Notable for the following components:   Beta-Hydroxybutyric Acid 0.79 (*)    All other components within normal limits  LIPASE, BLOOD - Abnormal; Notable for the following components:   Lipase 141 (*)    All other components within normal limits  CBG MONITORING, ED - Abnormal; Notable for the following components:   Glucose-Capillary >600 (*)    All other components within normal limits  I-STAT VENOUS BLOOD GAS, ED - Abnormal; Notable for the following components:   pCO2, Ven 42.8 (*)    Acid-base deficit 3.0 (*)    Sodium 131 (*)    All  other components within normal limits  URINALYSIS, MICROSCOPIC (REFLEX)  BASIC METABOLIC PANEL WITH GFR  CBG MONITORING, ED    EKG: None  Radiology: CT ABDOMEN PELVIS W CONTRAST Result Date: 05/11/2024 CLINICAL DATA:  Pancreatitis, nausea EXAM: CT ABDOMEN AND PELVIS WITH CONTRAST TECHNIQUE: Multidetector CT imaging of the abdomen and pelvis was performed using the standard protocol following bolus administration of intravenous contrast. RADIATION DOSE REDUCTION: This exam was performed according to the departmental dose-optimization program which includes automated exposure control, adjustment of the mA and/or kV according to patient size and/or use of iterative reconstruction technique. CONTRAST:  80mL OMNIPAQUE  IOHEXOL  300 MG/ML  SOLN COMPARISON:  None Available. FINDINGS: Lower chest: No acute abnormality. Hepatobiliary: No focal liver abnormality is seen. No gallstones, gallbladder wall thickening, or biliary dilatation. Pancreas: Pancreatic duct is slightly prominent measuring up to 4 mm. No suspicious finding to suggest pancreatic mass, abnormal enhancement the or peripancreatic fluid collection. A partial web versus thin linear density within the pancreatic duct at the uncinate process (2/29). Spleen: Normal in size without focal abnormality. Adrenals/Urinary Tract: . Thickening of left adrenal gland without discrete nodule Right adrenal is normal. Left kidney lower pole nonobstructing nephrolithiasis measuring 4 mm. Fullness of bilateral renal collecting systems left greater than right. Few subcentimeter simple renal cortical cysts which does not require imaging follow-up. Stomach/Bowel: Stomach is within normal limits. Appendix appears normal. No evidence of bowel wall thickening, distention, or inflammatory changes. Vascular/Lymphatic: No significant vascular findings are present. Few pericentimeter mesenteric root lymph nodes measuring up to 1.1 cm (7/38 Reproductive: Prostatomegaly. Midline  calcified density in prostate along the prostatic urethra may represent a parenchymal calcification versus a ureteral stone measuring 3.7 x 7 mm (2/74) Other: Small inguinal hernias left greater than right. Mild lateral small fat containing inguinal hernias. Small fat containing left umbilical/paraumbilical hernia with a defect measuring 1.2 cm. Musculoskeletal: L4 Schmorl's node. Degenerative changes of the spine. IMPRESSION: Prominent pancreatic duct without definite mass lesion or peripancreatic fluid collection. A partial web versus linear intraluminal artifact from vascular structure along the pancreatic duct at the uncinate process , indeterminate. Recommend dedicated MRCP for further assessment if clinically warranted. Mesenteric root prominent lymph nodes, likely reactive Left kidney nonobstructive nephrolithiasis. Mild fullness of the bilateral extrarenal pelvises left greater than right (UPJ stenosis appearance). Additional findings as above. Electronically Signed   By: Megan  Zare M.D.   On: 05/11/2024 14:12    {Document cardiac monitor, telemetry assessment procedure when appropriate:32947} Procedures  Medications Ordered in the ED  lactated ringers  bolus 1,000 mL (0 mLs Intravenous Stopped 05/11/24 1252)  insulin  aspart (novoLOG ) injection 10 Units (10 Units Subcutaneous Given 05/11/24 1147)  lactated ringers  bolus 1,000 mL (0 mLs Intravenous Stopped 05/11/24 1355)  iohexol  (OMNIPAQUE ) 300 MG/ML solution 80 mL (80 mLs Intravenous Contrast Given 05/11/24 1227)    Clinical Course as of 05/11/24 1504  Mon May 11, 2024  1136 pH, Ven: 7.343 [TY]  1136 Glucose-Capillary(!!): >600 [TY]  1136 WBC: 7.9 [TY]  1207 Comprehensive metabolic panel(!!) Pseudohyponatremia.  Minor elevation in potassium.  Hyperglycemic with borderline bicarb, no anion gap [TY]  1207 Lipase(!): 141 Will get CT scan to further evaluate; did have elevation a year ago.  [TY]  1501 CT ABDOMEN PELVIS W  CONTRAST IMPRESSION: Prominent pancreatic duct without definite mass lesion or peripancreatic fluid collection.  A partial web versus linear intraluminal artifact from vascular structure along the pancreatic duct at the uncinate process , indeterminate. Recommend dedicated MRCP for further assessment if clinically warranted.  Mesenteric root prominent lymph nodes, likely reactive  Left kidney nonobstructive nephrolithiasis. Mild fullness of the bilateral extrarenal pelvises left greater than right (UPJ stenosis appearance).  Additional findings as above.   Electronically Signed   By: Megan  Zare M.D.   On: 05/11/2024 14:12   [TY]    Clinical Course User Index [TY] Neysa Caron PARAS, DO   {Click here for ABCD2, HEART and other calculators REFRESH Note before signing:1}                              Medical Decision Making This is a 58 year old male presenting emergency department for hyperglycemia.  Is afebrile nontachycardic, slightly hypertensive.  Physical exam reassuring without localizing neurodeficits, soft benign abdomen.  Clear lungs.  Initial blood sugar 600, however follow-up labs does not appear to be in overt DKA as his pH is normal and no anion gap.  Borderline bicarb, and mildly elevated beta hydroxybutyrate.  He was given insulin , IV fluids.  His blood work also showed elevated lipase, but no transaminitis or elevated bilirubin.  Per chart review had minor elevation close 2-year ago.  CT scan with prominent pancreatic duct, he is feeling improved after medications.  Will defer further workup to outpatient setting with his PCP/gastroenterology.  Awaiting repeat labs, if blood sugar improved will discharge.  Amount and/or Complexity of Data Reviewed Labs: ordered. Decision-making details documented in ED Course. Radiology: ordered. Decision-making details documented in ED Course.  Risk Prescription drug management.   {Document critical care time when appropriate   Document review of labs and clinical decision tools ie CHADS2VASC2, etc  Document your independent review of radiology images and any outside records  Document your discussion with family members, caretakers and with consultants  Document social determinants of health affecting pt's care  Document your decision making why or why not admission, treatments were needed:32947:::1}   Final diagnoses:  None    ED Discharge Orders     None

## 2024-05-11 NOTE — ED Triage Notes (Signed)
 Pt c/o dizziness, nausea since appx 0700 today when getting to work.   Pt answers questions appropriately, slurred speech noted.  Pt reports taking ozempic qweekly.

## 2024-05-26 ENCOUNTER — Observation Stay (HOSPITAL_BASED_OUTPATIENT_CLINIC_OR_DEPARTMENT_OTHER)
Admission: EM | Admit: 2024-05-26 | Discharge: 2024-05-28 | Disposition: A | Discharge status: Home / Self Care | Attending: Internal Medicine | Admitting: Internal Medicine

## 2024-05-26 ENCOUNTER — Encounter (HOSPITAL_BASED_OUTPATIENT_CLINIC_OR_DEPARTMENT_OTHER): Payer: Self-pay | Admitting: Urology

## 2024-05-26 ENCOUNTER — Other Ambulatory Visit: Payer: Self-pay

## 2024-05-26 DIAGNOSIS — Z794 Long term (current) use of insulin: Secondary | ICD-10-CM | POA: Diagnosis not present

## 2024-05-26 DIAGNOSIS — E1165 Type 2 diabetes mellitus with hyperglycemia: Secondary | ICD-10-CM | POA: Diagnosis not present

## 2024-05-26 DIAGNOSIS — Z79899 Other long term (current) drug therapy: Secondary | ICD-10-CM | POA: Insufficient documentation

## 2024-05-26 DIAGNOSIS — E860 Dehydration: Secondary | ICD-10-CM | POA: Diagnosis not present

## 2024-05-26 DIAGNOSIS — E875 Hyperkalemia: Secondary | ICD-10-CM | POA: Diagnosis not present

## 2024-05-26 DIAGNOSIS — R739 Hyperglycemia, unspecified: Principal | ICD-10-CM | POA: Diagnosis present

## 2024-05-26 DIAGNOSIS — E785 Hyperlipidemia, unspecified: Secondary | ICD-10-CM | POA: Insufficient documentation

## 2024-05-26 DIAGNOSIS — R531 Weakness: Secondary | ICD-10-CM | POA: Diagnosis present

## 2024-05-26 DIAGNOSIS — I10 Essential (primary) hypertension: Secondary | ICD-10-CM | POA: Insufficient documentation

## 2024-05-26 DIAGNOSIS — Z7984 Long term (current) use of oral hypoglycemic drugs: Secondary | ICD-10-CM | POA: Diagnosis not present

## 2024-05-26 DIAGNOSIS — N179 Acute kidney failure, unspecified: Secondary | ICD-10-CM | POA: Insufficient documentation

## 2024-05-26 LAB — URINALYSIS, ROUTINE W REFLEX MICROSCOPIC
Bilirubin Urine: NEGATIVE
Glucose, UA: >=500 mg/dL — AB
Hgb urine dipstick: NEGATIVE
Ketones, ur: NEGATIVE mg/dL
Leukocytes,Ua: NEGATIVE
Nitrite: NEGATIVE
Protein, ur: NEGATIVE mg/dL
Specific Gravity, Urine: <=1.005 (ref 1.005–1.030)
pH: 5.5 (ref 5.0–8.0)

## 2024-05-26 LAB — URINALYSIS, MICROSCOPIC (REFLEX)

## 2024-05-26 LAB — BASIC METABOLIC PANEL WITH GFR
Anion gap: 17 — ABNORMAL HIGH (ref 5–15)
Anion gap: 15 (ref 5–15)
BUN: 36 mg/dL — ABNORMAL HIGH (ref 6–20)
BUN: 32 mg/dL — ABNORMAL HIGH (ref 6–20)
CO2: 24 mmol/L (ref 22–32)
CO2: 24 mmol/L (ref 22–32)
Calcium: 11.6 mg/dL — ABNORMAL HIGH (ref 8.9–10.3)
Calcium: 11.4 mg/dL — ABNORMAL HIGH (ref 8.9–10.3)
Chloride: 91 mmol/L — ABNORMAL LOW (ref 98–111)
Chloride: 97 mmol/L — ABNORMAL LOW (ref 98–111)
Creatinine, Ser: 1.59 mg/dL — ABNORMAL HIGH (ref 0.61–1.24)
Creatinine, Ser: 1.42 mg/dL — ABNORMAL HIGH (ref 0.61–1.24)
GFR, Estimated: 50 mL/min — ABNORMAL LOW (ref >60)
GFR, Estimated: 58 mL/min — ABNORMAL LOW (ref >60)
Glucose, Bld: 1087 mg/dL (ref 70–99)
Glucose, Bld: 761 mg/dL (ref 70–99)
Potassium: 5.6 mmol/L — ABNORMAL HIGH (ref 3.5–5.1)
Potassium: 5.7 mmol/L — ABNORMAL HIGH (ref 3.5–5.1)
Sodium: 132 mmol/L — ABNORMAL LOW (ref 135–145)
Sodium: 137 mmol/L (ref 135–145)

## 2024-05-26 LAB — CBG MONITORING, ED
Glucose-Capillary: 348 mg/dL — ABNORMAL HIGH (ref 70–99)
Glucose-Capillary: 569 mg/dL (ref 70–99)
Glucose-Capillary: >600 mg/dL (ref 70–99)
Glucose-Capillary: >600 mg/dL (ref 70–99)

## 2024-05-26 LAB — CK: Total CK: 200 U/L (ref 49–397)

## 2024-05-26 LAB — CBC
HCT: 43.4 % (ref 39.0–52.0)
Hemoglobin: 16.1 g/dL (ref 13.0–17.0)
MCH: 31.9 pg (ref 26.0–34.0)
MCHC: 37.1 g/dL — ABNORMAL HIGH (ref 30.0–36.0)
MCV: 85.9 fL (ref 80.0–100.0)
Platelets: 518 K/uL — ABNORMAL HIGH (ref 150–400)
RBC: 5.05 MIL/uL (ref 4.22–5.81)
RDW: 11 % — ABNORMAL LOW (ref 11.5–15.5)
WBC: 7.2 K/uL (ref 4.0–10.5)
nRBC: 0 % (ref 0.0–0.2)

## 2024-05-26 LAB — BETA-HYDROXYBUTYRIC ACID: Beta-Hydroxybutyric Acid: 1.16 mmol/L — ABNORMAL HIGH (ref 0.05–0.27)

## 2024-05-26 MED ORDER — LACTATED RINGERS IV BOLUS
1000.0000 mL | Freq: Once | INTRAVENOUS | Status: AC
  Administered 2024-05-26: 1000 mL via INTRAVENOUS

## 2024-05-26 MED ORDER — INSULIN REGULAR(HUMAN) IN NACL 100-0.9 UT/100ML-% IV SOLN
INTRAVENOUS | Status: DC
  Administered 2024-05-26: 10 [IU]/h via INTRAVENOUS
  Filled 2024-05-26: qty 100

## 2024-05-26 MED ORDER — LACTATED RINGERS IV SOLN: INTRAVENOUS | Status: AC

## 2024-05-26 MED ORDER — DEXTROSE IN LACTATED RINGERS 5 % IV SOLN: INTRAVENOUS | Status: AC

## 2024-05-26 MED ORDER — DEXTROSE 50 % IV SOLN: 0.0000 mL | INTRAVENOUS | Status: DC | PRN

## 2024-05-26 NOTE — ED Notes (Signed)
CBG 569 

## 2024-05-26 NOTE — ED Triage Notes (Addendum)
 Pt states concern for blood sugar to be high, states has been feeling weak and dizzy since last seen on 8/25  States not getting any better since last time he was seeing  States limited appetite , only eating fruit and water   Took himself off of ozempic and metformin   it aint right for me  Has not taken BP meds in a couple days, hypertensive at triage   Unable to get into PCP till November

## 2024-05-26 NOTE — ED Notes (Signed)
 Pt states  I need some days off work, tell the doctor

## 2024-05-26 NOTE — ED Provider Notes (Signed)
 [place] [facility] Provider Note   CSN: [id number] Arrival date & time: 05/26/24  1913     History Chief Complaint  Patient presents with   Hyperglycemia    HPI: Tyler Wallace is a 58 y.o. male with history pertinent for T2DM prescribed Ozempic and metformin , HTN, hypercholesterolemia who presents complaining of generalized weakness and elevated sugar. Patient arrived via POV.  History provided by patient.  No interpreter required during this encounter.  Patient reports that he is here for multiple symptoms.  Reports that that he has been nonadherent to his Ozempic and metformin  for approximately 3 weeks.  Reports it is because he does not like how these medications make him feel.  Reports that he preferred when he was on Levemir.  Reports that he was recently seen in the emergency department for hyperglycemia as well.  Reports that since he was discharged she has continued to be nonadherent to his medications, and has had progressive polydipsia, polyuria, fatigue, generalized weakness.  Reports that he also has dizziness and mild dyspnea upon standing.  Denies fever, chills, chest pain, nausea, vomiting, diarrhea, abdominal pain.  Patient's recorded medical, surgical, social, medication list and allergies were reviewed in the Snapshot window as part of the initial history.   Prior to Admission medications   Medication Sig Start Date End Date Taking? Authorizing Provider  amLODipine  (NORVASC ) 10 MG tablet Take 0.5 tablets (5 mg total) by mouth daily. 07/21/12   Roselyn Carlin NOVAK, MD  lisinopril  (PRINIVIL ,ZESTRIL ) 40 MG tablet Take 1 tablet (40 mg total) by mouth daily. 07/21/12   Roselyn Carlin NOVAK, MD  metFORMIN  (GLUCOPHAGE ) 500 MG tablet Take 1 tablet (500 mg total) by mouth 2 (two) times daily with a meal. 07/21/12   Roselyn Carlin NOVAK, MD  ondansetron  (ZOFRAN -ODT) 4 MG disintegrating tablet Take 1 tablet (4 mg total) by mouth every 8 (eight) hours as  needed for nausea or vomiting. 01/09/23   Yolande Lamar BROCKS, MD     Allergies: Patient has no known allergies.   Review of Systems   ROS as per HPI  Physical Exam Updated Vital Signs BP (!) 173/155 (BP Location: Right Arm)   Pulse (!) 115   Temp 97.9 F (36.6 C)   Resp 18   Ht 5' 6 (1.676 m)   Wt 68 kg   SpO2 100%   BMI 24.21 kg/m  Physical Exam Vitals and nursing note reviewed.  Constitutional:      General: He is not in acute distress.    Appearance: He is well-developed.  HENT:     Head: Normocephalic and atraumatic.  Eyes:     Conjunctiva/sclera: Conjunctivae normal.  Cardiovascular:     Rate and Rhythm: Regular rhythm. Tachycardia present.     Heart sounds: No murmur heard. Pulmonary:     Effort: Pulmonary effort is normal. No respiratory distress.     Breath sounds: Normal breath sounds.  Abdominal:     Palpations: Abdomen is soft.     Tenderness: There is no abdominal tenderness.  Musculoskeletal:        General: No swelling.     Cervical back: Neck supple.  Skin:    General: Skin is warm and dry.     Capillary Refill: Capillary refill takes less than 2 seconds.  Neurological:     Mental Status: He is alert.  Psychiatric:        Mood and Affect: Mood normal.     ED Course/ Medical  Decision Making/ A&P    Procedures Procedures   Medications Ordered in ED Medications - No data to display  Medical Decision Making:   Asmar Brozek is a 58 y.o. male who presents for ***as per above.  Physical exam is pertinent for ***.   The differential includes but is not limited to ***.  Independent historian: {LSHISTORIAN:33403}  External data reviewed: {LSEXTERNALDATA:33407}  Initial Plan:  ***  ***Screening labs including CBC and Metabolic panel to evaluate for infectious or metabolic etiology of disease.  ***Urinalysis with reflex culture ordered to evaluate for UTI or relevant urologic/nephrologic pathology.  ***CXR to evaluate for  structural/infectious intrathoracic pathology.  {crccardiactesting:32591::EKG to evaluate for cardiac pathology} Objective evaluation as below reviewed   Labs: {LSLABS:33416}  Radiology: {LSRADS:33417} CT ABDOMEN PELVIS W CONTRAST Result Date: 05/11/2024 CLINICAL DATA:  Pancreatitis, nausea EXAM: CT ABDOMEN AND PELVIS WITH CONTRAST TECHNIQUE: Multidetector CT imaging of the abdomen and pelvis was performed using the standard protocol following bolus administration of intravenous contrast. RADIATION DOSE REDUCTION: This exam was performed according to the departmental dose-optimization program which includes automated exposure control, adjustment of the mA and/or kV according to patient size and/or use of iterative reconstruction technique. CONTRAST:  80mL OMNIPAQUE  IOHEXOL  300 MG/ML  SOLN COMPARISON:  None Available. FINDINGS: Lower chest: No acute abnormality. Hepatobiliary: No focal liver abnormality is seen. No gallstones, gallbladder wall thickening, or biliary dilatation. Pancreas: Pancreatic duct is slightly prominent measuring up to 4 mm. No suspicious finding to suggest pancreatic mass, abnormal enhancement the or peripancreatic fluid collection. A partial web versus thin linear density within the pancreatic duct at the uncinate process (2/29). Spleen: Normal in size without focal abnormality. Adrenals/Urinary Tract: . Thickening of left adrenal gland without discrete nodule Right adrenal is normal. Left kidney lower pole nonobstructing nephrolithiasis measuring 4 mm. Fullness of bilateral renal collecting systems left greater than right. Few subcentimeter simple renal cortical cysts which does not require imaging follow-up. Stomach/Bowel: Stomach is within normal limits. Appendix appears normal. No evidence of bowel wall thickening, distention, or inflammatory changes. Vascular/Lymphatic: No significant vascular findings are present. Few pericentimeter mesenteric root lymph nodes measuring up to  1.1 cm (7/38 Reproductive: Prostatomegaly. Midline calcified density in prostate along the prostatic urethra may represent a parenchymal calcification versus a ureteral stone measuring 3.7 x 7 mm (2/74) Other: Small inguinal hernias left greater than right. Mild lateral small fat containing inguinal hernias. Small fat containing left umbilical/paraumbilical hernia with a defect measuring 1.2 cm. Musculoskeletal: L4 Schmorl's node. Degenerative changes of the spine. IMPRESSION: Prominent pancreatic duct without definite mass lesion or peripancreatic fluid collection. A partial web versus linear intraluminal artifact from vascular structure along the pancreatic duct at the uncinate process , indeterminate. Recommend dedicated MRCP for further assessment if clinically warranted. Mesenteric root prominent lymph nodes, likely reactive Left kidney nonobstructive nephrolithiasis. Mild fullness of the bilateral extrarenal pelvises left greater than right (UPJ stenosis appearance). Additional findings as above. Electronically Signed   By: Megan  Zare M.D.   On: 05/11/2024 14:12    EKG/Medicine tests: {LSEKG:33414} EKG Interpretation:    Decision rules:  {Document cardiac monitor, telemetry assessment procedure when appropriate:1} {   Click here for ABCD2, HEART and other calculatorsREFRESH Note before signing :1}   {Document critical care time when appropriate:1} {Document review of labs and clinical decision tools ie heart score, Chads2Vasc2 etc:1}  {Document your independent review of radiology images, and any outside records:1} {Document your discussion with family members, caretakers, and with consultants:1} {Document social  determinants of health affecting pt's care:1} {Document your decision making why or why not admission, treatments were needed:1}                Interventions:***   See the EMR for full details regarding lab and imaging results.  ***  {LSCOPA:33420}  Discussion of management  or test interpretations with external provider(s): ***  Risk Drugs:{LSDRUGS:33399} Treatment: {LSTREATMENT:33409} Surgery:{LSSURGERY:33410} Critical Care: ***  Disposition: {LSDISPO:33388}  MDM generated using voice dictation software and may contain dictation errors.  Please contact me for any clarification or with any questions.  Clinical Impression: No diagnosis found.   Data Unavailable   Final Clinical Impression(s) / ED Diagnoses Final diagnoses:  None    Rx / DC Orders ED Discharge Orders     None

## 2024-05-27 DIAGNOSIS — I10 Essential (primary) hypertension: Secondary | ICD-10-CM | POA: Diagnosis not present

## 2024-05-27 DIAGNOSIS — R739 Hyperglycemia, unspecified: Principal | ICD-10-CM

## 2024-05-27 DIAGNOSIS — E860 Dehydration: Secondary | ICD-10-CM | POA: Diagnosis present

## 2024-05-27 DIAGNOSIS — R531 Weakness: Secondary | ICD-10-CM | POA: Diagnosis not present

## 2024-05-27 DIAGNOSIS — N179 Acute kidney failure, unspecified: Secondary | ICD-10-CM | POA: Diagnosis present

## 2024-05-27 DIAGNOSIS — Z743 Need for continuous supervision: Secondary | ICD-10-CM | POA: Diagnosis not present

## 2024-05-27 LAB — LIPID PANEL
Cholesterol: 267 mg/dL — ABNORMAL HIGH (ref 0–200)
HDL: 53 mg/dL (ref >40)
LDL Cholesterol: 196 mg/dL — ABNORMAL HIGH (ref 0–99)
Total CHOL/HDL Ratio: 5 ratio
Triglycerides: 89 mg/dL (ref <150)
VLDL: 18 mg/dL (ref 0–40)

## 2024-05-27 LAB — CBG MONITORING, ED
Glucose-Capillary: 127 mg/dL — ABNORMAL HIGH (ref 70–99)
Glucose-Capillary: 155 mg/dL — ABNORMAL HIGH (ref 70–99)
Glucose-Capillary: 167 mg/dL — ABNORMAL HIGH (ref 70–99)
Glucose-Capillary: 201 mg/dL — ABNORMAL HIGH (ref 70–99)
Glucose-Capillary: 204 mg/dL — ABNORMAL HIGH (ref 70–99)

## 2024-05-27 LAB — BASIC METABOLIC PANEL WITH GFR
Anion gap: 9 (ref 5–15)
BUN: 22 mg/dL — ABNORMAL HIGH (ref 6–20)
CO2: 29 mmol/L (ref 22–32)
Calcium: 10.6 mg/dL — ABNORMAL HIGH (ref 8.9–10.3)
Chloride: 107 mmol/L (ref 98–111)
Creatinine, Ser: 1.16 mg/dL (ref 0.61–1.24)
GFR, Estimated: >60 mL/min (ref >60)
Glucose, Bld: 242 mg/dL — ABNORMAL HIGH (ref 70–99)
Potassium: 4 mmol/L (ref 3.5–5.1)
Sodium: 145 mmol/L (ref 135–145)

## 2024-05-27 LAB — BETA-HYDROXYBUTYRIC ACID: Beta-Hydroxybutyric Acid: 0.48 mmol/L — ABNORMAL HIGH (ref 0.05–0.27)

## 2024-05-27 LAB — GLUCOSE, CAPILLARY
Glucose-Capillary: 171 mg/dL — ABNORMAL HIGH (ref 70–99)
Glucose-Capillary: 218 mg/dL — ABNORMAL HIGH (ref 70–99)
Glucose-Capillary: 229 mg/dL — ABNORMAL HIGH (ref 70–99)
Glucose-Capillary: 246 mg/dL — ABNORMAL HIGH (ref 70–99)
Glucose-Capillary: 229 mg/dL — ABNORMAL HIGH (ref 70–99)
Glucose-Capillary: 208 mg/dL — ABNORMAL HIGH (ref 70–99)
Glucose-Capillary: 319 mg/dL — ABNORMAL HIGH (ref 70–99)
Glucose-Capillary: 230 mg/dL — ABNORMAL HIGH (ref 70–99)
Glucose-Capillary: 59 mg/dL — ABNORMAL LOW (ref 70–99)
Glucose-Capillary: 69 mg/dL — ABNORMAL LOW (ref 70–99)
Glucose-Capillary: 156 mg/dL — ABNORMAL HIGH (ref 70–99)
Glucose-Capillary: 157 mg/dL — ABNORMAL HIGH (ref 70–99)
Glucose-Capillary: 277 mg/dL — ABNORMAL HIGH (ref 70–99)
Glucose-Capillary: 287 mg/dL — ABNORMAL HIGH (ref 70–99)

## 2024-05-27 LAB — HIV ANTIBODY (ROUTINE TESTING W REFLEX): HIV Screen 4th Generation wRfx: NONREACTIVE

## 2024-05-27 LAB — CBC
HCT: 43.2 % (ref 39.0–52.0)
Hemoglobin: 15.7 g/dL (ref 13.0–17.0)
MCH: 31.8 pg (ref 26.0–34.0)
MCHC: 36.3 g/dL — ABNORMAL HIGH (ref 30.0–36.0)
MCV: 87.6 fL (ref 80.0–100.0)
Platelets: 450 K/uL — ABNORMAL HIGH (ref 150–400)
RBC: 4.93 MIL/uL (ref 4.22–5.81)
RDW: 11.3 % — ABNORMAL LOW (ref 11.5–15.5)
WBC: 8.7 K/uL (ref 4.0–10.5)
nRBC: 0 % (ref 0.0–0.2)

## 2024-05-27 LAB — HEMOGLOBIN A1C
Hgb A1c MFr Bld: 12.5 % — ABNORMAL HIGH (ref 4.8–5.6)
Mean Plasma Glucose: 312.05 mg/dL

## 2024-05-27 MED ORDER — HYDRALAZINE HCL 20 MG/ML IJ SOLN: 10.0000 mg | INTRAMUSCULAR | Status: DC | PRN

## 2024-05-27 MED ORDER — ENOXAPARIN SODIUM 40 MG/0.4ML IJ SOSY
40.0000 mg | PREFILLED_SYRINGE | INTRAMUSCULAR | Status: DC
  Administered 2024-05-27 – 2024-05-28 (×2): 40 mg via SUBCUTANEOUS
  Filled 2024-05-27 (×2): qty 0.4

## 2024-05-27 MED ORDER — OXYCODONE HCL 5 MG PO TABS: 5.0000 mg | ORAL_TABLET | ORAL | Status: DC | PRN

## 2024-05-27 MED ORDER — ACETAMINOPHEN 650 MG RE SUPP: 650.0000 mg | Freq: Four times a day (QID) | RECTAL | Status: DC | PRN

## 2024-05-27 MED ORDER — ONDANSETRON HCL 4 MG PO TABS: 4.0000 mg | ORAL_TABLET | Freq: Four times a day (QID) | ORAL | Status: DC | PRN

## 2024-05-27 MED ORDER — HYDROMORPHONE HCL 1 MG/ML IJ SOLN: 0.5000 mg | INTRAMUSCULAR | Status: DC | PRN

## 2024-05-27 MED ORDER — LOSARTAN POTASSIUM 50 MG PO TABS
50.0000 mg | ORAL_TABLET | Freq: Every day | ORAL | Status: DC
  Administered 2024-05-27 – 2024-05-28 (×2): 50 mg via ORAL
  Filled 2024-05-27 (×2): qty 1

## 2024-05-27 MED ORDER — ACETAMINOPHEN 325 MG PO TABS: 650.0000 mg | ORAL_TABLET | Freq: Four times a day (QID) | ORAL | Status: DC | PRN

## 2024-05-27 MED ORDER — IPRATROPIUM-ALBUTEROL 0.5-2.5 (3) MG/3ML IN SOLN: 3.0000 mL | RESPIRATORY_TRACT | Status: DC | PRN

## 2024-05-27 MED ORDER — SENNOSIDES-DOCUSATE SODIUM 8.6-50 MG PO TABS: 1.0000 | ORAL_TABLET | Freq: Every evening | ORAL | Status: DC | PRN

## 2024-05-27 MED ORDER — ATORVASTATIN CALCIUM 40 MG PO TABS
40.0000 mg | ORAL_TABLET | Freq: Every day | ORAL | Status: DC
  Administered 2024-05-27 – 2024-05-28 (×2): 40 mg via ORAL
  Filled 2024-05-27 (×2): qty 1

## 2024-05-27 MED ORDER — INSULIN GLARGINE-YFGN 100 UNIT/ML ~~LOC~~ SOLN: 15.0000 [IU] | Freq: Every day | SUBCUTANEOUS | Status: DC

## 2024-05-27 MED ORDER — BISACODYL 5 MG PO TBEC: 5.0000 mg | DELAYED_RELEASE_TABLET | Freq: Every day | ORAL | Status: DC | PRN

## 2024-05-27 MED ORDER — DEXTROSE 50 % IV SOLN: 30.0000 mL | Freq: Once | INTRAVENOUS | Status: AC

## 2024-05-27 MED ORDER — INSULIN GLARGINE 100 UNIT/ML ~~LOC~~ SOLN
15.0000 [IU] | Freq: Every day | SUBCUTANEOUS | Status: DC
  Administered 2024-05-27: 15 [IU] via SUBCUTANEOUS
  Filled 2024-05-27 (×2): qty 0.15

## 2024-05-27 MED ORDER — METOPROLOL TARTRATE 5 MG/5ML IV SOLN: 5.0000 mg | INTRAVENOUS | Status: DC | PRN

## 2024-05-27 MED ORDER — INSULIN ASPART 100 UNIT/ML IJ SOLN
0.0000 [IU] | Freq: Every day | INTRAMUSCULAR | Status: DC
  Administered 2024-05-27: 3 [IU] via SUBCUTANEOUS

## 2024-05-27 MED ORDER — FLEET ENEMA RE ENEM: 1.0000 | ENEMA | Freq: Once | RECTAL | Status: DC | PRN

## 2024-05-27 MED ORDER — NICOTINE 21 MG/24HR TD PT24: 21.0000 mg | MEDICATED_PATCH | Freq: Every day | TRANSDERMAL | Status: DC | PRN

## 2024-05-27 MED ORDER — DEXTROSE 50 % IV SOLN
INTRAVENOUS | Status: AC
  Filled 2024-05-27: qty 50

## 2024-05-27 MED ORDER — INSULIN ASPART 100 UNIT/ML IJ SOLN
3.0000 [IU] | Freq: Three times a day (TID) | INTRAMUSCULAR | Status: DC
  Administered 2024-05-27 – 2024-05-28 (×3): 3 [IU] via SUBCUTANEOUS

## 2024-05-27 MED ORDER — ONDANSETRON HCL 4 MG/2ML IJ SOLN: 4.0000 mg | Freq: Four times a day (QID) | INTRAMUSCULAR | Status: DC | PRN

## 2024-05-27 MED ORDER — INSULIN ASPART 100 UNIT/ML IJ SOLN
0.0000 [IU] | Freq: Three times a day (TID) | INTRAMUSCULAR | Status: DC
  Administered 2024-05-27: 8 [IU] via SUBCUTANEOUS
  Administered 2024-05-28: 5 [IU] via SUBCUTANEOUS
  Administered 2024-05-28: 15 [IU] via SUBCUTANEOUS

## 2024-05-27 NOTE — TOC Initial Note (Addendum)
 Transition of Care [facility]) - Initial/Assessment Note    Patient Details  Name: Tyler Wallace MRN: [id number] Date of Birth: 27-May-1966  [facility] CM/SW Contact:    Sudie Erminio Deems, RN Phone Number: 05/27/2024, 11:31 AM  Clinical Narrative: Patient presented for hyperglycemia. PTA patient was from home with his significant other. Case Manager notified the [facility] Coordinator of hospitalization. 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 PCP is Santana Nam [phone] ext [phone]. Fax: [phone]. CSW is Shcaleah Kelton [phone] ext [phone]. Case Manager has called [facility] to arrange a PCP appointment-awaiting call back from the office. Patient asked Case Manager about Medicaid-Patient is open to have Case Manager submit information the Financial Counselor to see if he is eligible for Medicaid. Patient states he uses the [facility] for medications. Case Manager will continue to follow for additional needs as he progresses.          1153 05-27-24 Melissa RN with the [facility] will speak with the scheduling team to see if any appointments are available.   1231 05-27-24 Hospital follow up scheduled for Friday 05-29-24 @ 3:30 pm for hospital follow up. Information placed on the AVS. No further needs identified at this time.    Per Financial Counselor: Patient has been screened via phone and pt is considered over income for Medicaid with a monthly income of $2,730/month.  Expected Discharge Plan: Home/Self Care Barriers to Discharge: No Barriers Identified   Patient Goals and CMS Choice Patient states their goals for this hospitalization and ongoing recovery are:: plan to return home          Expected Discharge Plan and Services In-house Referral: Financial Counselor Discharge Planning Services: Follow-up appt scheduled Post Acute Care Choice: NA Living arrangements for the past 2 months: Single  Family Home                   DME Agency: NA                  Prior Living Arrangements/Services Living arrangements for the past 2 months: Single Family Home Lives with:: Significant Other Patient language and need for interpreter reviewed:: Yes Do you feel safe going back to the place where you live?: Yes      Need for Family Participation in Patient Care: No (Comment) Care giver support system in place?: No (comment)   Criminal Activity/Legal Involvement Pertinent to Current Situation/Hospitalization: No - Comment as needed  Activities of Daily Living   ADL Screening (condition at time of admission) Independently performs ADLs?: Yes (appropriate for developmental age) Is the patient deaf or have difficulty hearing?: No Does the patient have difficulty seeing, even when wearing glasses/contacts?: No Does the patient have difficulty concentrating, remembering, or making decisions?: No  Permission Sought/Granted Permission sought to share information with : Case Manager, Family Supports                Emotional Assessment Appearance:: Appears stated age Attitude/Demeanor/Rapport: Engaged Affect (typically observed): Appropriate Orientation: : Oriented to Self, Oriented to Place, Oriented to  Time, Oriented to Situation Alcohol / Substance Use: Not Applicable Psych Involvement: No (comment)  Admission diagnosis:  Dehydration [E86.0] Hyperglycemia [R73.9] AKI (acute kidney injury) (HCC) [N17.9] Patient Active Problem List   Diagnosis Date Noted   Hyperglycemia 05/27/2024   PCP:  Clinic, [facility] Pharmacy:   [facility] - [place], [place] - [address]  [place] [address] Phone: [phone] Fax: [phone]  [facility] [address] [place] [address] Phone: [phone] Fax: [phone]     Social Drivers of Health (SDOH) Social  History: SDOH Screenings   Food Insecurity: No Food Insecurity (05/27/2024)  Housing: Low Risk  (05/27/2024)  Transportation Needs: No Transportation Needs (05/27/2024)  Utilities: Not At Risk (05/27/2024)  Social Connections: Unknown (01/29/2022)   Received from [facility]  Tobacco Use: Unknown (05/26/2024)   SDOH Interventions:     Readmission Risk Interventions     No data to display

## 2024-05-27 NOTE — Care Management Obs Status (Deleted)
 MEDICARE OBSERVATION STATUS NOTIFICATION   Patient Details  Name: Shafer Swamy MRN: [id number] Date of Birth: [birth year]   Medicare Observation Status Notification Given:  Yes    Vonzell Arrie Sharps 05/27/2024, 9:00 AM

## 2024-05-27 NOTE — Progress Notes (Signed)
 Patient is AXOX4 and remains on room air with no clinical signs of distress or complaints of pain at this time.   Safety measures are in place, call light is within reach, and 4P's addressed.

## 2024-05-27 NOTE — Progress Notes (Signed)
 Hospitalist Transfer Note:    Nursing staff, Please call [facility] Admits & Consults System-Wide number on Amion [phone]) as soon as patient's arrival, so appropriate admitting provider can evaluate the pt.   Transferring facility: [facility] Requesting provider: Dr. Rogelia (EDP at [facility]) Reason for transfer: admission for further evaluation and management of hyperglycemia, without e/o dka.   58 year old male with history of type 2 diabetes mellitus, who presented to [facility] ED complaining of 1 to 2 weeks of generalized weakness in the absence of any acute focal weakness, along with worsening polyuria/polydipsia over that timeframe.  He has a history of type 2 diabetes mellitus but has been suboptimally compliant with his outpatient Ozempic.  He also notes that he needs a new PCP to help further manage his diabetes as an outpatient.  Labs were notable for presenting blood sugar of 1000.  CMP was reported to show no evidence of anion gap metabolic acidosis, which showed creatinine of 1.6 compared to his baseline of 1.0.  He was started on IV fluids as well as insulin  drip per Endo tool, with blood sugar trending down, most recently 700.  Subsequently, I accepted this patient for transfer for observation to a pcu bed at [facility] for further work-up and management of the above.     Eva Pore, DO Hospitalist

## 2024-05-27 NOTE — Progress Notes (Signed)
 Hypoglycemic Event  CBG: 69  Treatment: D50 50 mL (25 gm) 30ml (15g)  Symptoms: hot  Follow-up CBG: Time:1533 CBG Result:156  Possible Reasons for Event: Medication regimen:    Comments/MD notified:Yes. Received order to give 30mL of D50 based on Endotool suggestion.    Loyce Blazing

## 2024-05-27 NOTE — H&P (Signed)
 History and Physical    Tyler Wallace:[id number] DOB: 03/07/1966 DOA: 05/26/2024  PCP: [facility] Patient coming from: Home  Chief Complaint: Hide glycemia  HPI: Tyler Wallace is a 58 y.o. male with medical history significant of insulin -dependent DM 2, HTN, HLD comes to the hospital for evaluation of hyperglycemia.  Patient reports that he takes metformin  and Ozempic at home but recently stopped taking it especially because the way Ozempic makes him feel.  Denies any other complaints. In the ER noted to have blood glucose over 1000 with relatively normal anion gap.  He was started on insulin  drip.  He was also noted to have AKI therefore started on fluids.   Review of Systems: As per HPI otherwise 10 point review of systems negative.  Review of Systems Otherwise negative except as per HPI, including: General: Denies fever, chills, night sweats or unintended weight loss. Resp: Denies cough, wheezing, shortness of breath. Cardiac: Denies chest pain, palpitations, orthopnea, paroxysmal nocturnal dyspnea. GI: Denies abdominal pain, nausea, vomiting, diarrhea or constipation GU: Denies dysuria, frequency, hesitancy or incontinence MS: Denies muscle aches, joint pain or swelling Neuro: Denies headache, neurologic deficits (focal weakness, numbness, tingling), abnormal gait Psych: Denies anxiety, depression, SI/HI/AVH Skin: Denies new rashes or lesions ID: Denies sick contacts, exotic exposures, travel  Past Medical History:  Diagnosis Date   Diabetes mellitus without complication (HCC)    High cholesterol    Hypertension     History reviewed. No pertinent surgical history.  SOCIAL HISTORY:  reports that he has never smoked. He does not have any smokeless tobacco history on file. He reports current alcohol use. He reports current drug use. Drug: Marijuana.  No Known Allergies  FAMILY HISTORY: History reviewed. No pertinent family history.   Prior to Admission  medications   Medication Sig Start Date End Date Taking? Authorizing Provider  acetaminophen  (TYLENOL ) 500 MG tablet Take 500 mg by mouth every 6 (six) hours as needed for mild pain (pain score 1-3) or moderate pain (pain score 4-6).   Yes [provider]  amLODipine  (NORVASC ) 10 MG tablet Take 0.5 tablets (5 mg total) by mouth daily. Patient taking differently: Take 10 mg by mouth daily. 07/21/12  Yes Roselyn Carlin NOVAK, MD  atorvastatin  (LIPITOR) 20 MG tablet Take 20 mg by mouth at bedtime.   Yes [provider]  buPROPion (WELLBUTRIN XL) 300 MG 24 hr tablet Take 300 mg by mouth daily.   Yes [provider]  celecoxib (CELEBREX) 200 MG capsule Take 200 mg by mouth 2 (two) times daily as needed for moderate pain (pain score 4-6) or mild pain (pain score 1-3).   Yes [provider]  gabapentin (NEURONTIN) 300 MG capsule Take 300 mg by mouth 3 (three) times daily as needed (pain).   Yes [provider]  lisinopril  (PRINIVIL ,ZESTRIL ) 40 MG tablet Take 1 tablet (40 mg total) by mouth daily. 07/21/12  Yes Roselyn Carlin NOVAK, MD  sertraline (ZOLOFT) 100 MG tablet Take 100 mg by mouth daily.   Yes [provider]  sildenafil (VIAGRA) 50 MG tablet Take 50 mg by mouth daily as needed for erectile dysfunction.   Yes [provider]  metFORMIN  (GLUCOPHAGE ) 500 MG tablet Take 1 tablet (500 mg total) by mouth 2 (two) times daily with a meal. Patient not taking: Reported on 05/27/2024 07/21/12   Roselyn Carlin NOVAK, MD  Semaglutide,0.25 or 0.5MG /DOS, 2 MG/3ML SOPN Inject 0.5 mg into the skin once a week. Patient not taking: Reported  on 05/27/2024 08/05/23   [provider]    Physical Exam: Vitals:   05/27/24 0255 05/27/24 0556 05/27/24 0747 05/27/24 1117  BP: (!) 143/106 (!) 156/99 (!) 147/102 (!) (P) 145/113  Pulse:  88 93 96  Resp: 18 19 15  (P) 17  Temp: 98.7 F (37.1 C) 98.1 F (36.7 C) 98.2 F (36.8 C) (P) 98 F (36.7 C)  TempSrc:  Oral Oral Oral (P) Oral  SpO2: 100% 100% 100% 100%  Weight:      Height:          Constitutional: NAD, calm, comfortable Eyes: PERRL, lids and conjunctivae normal ENMT: Mucous membranes are moist. Posterior pharynx clear of any exudate or lesions.Normal dentition.  Neck: normal, supple, no masses, no thyromegaly Respiratory: clear to auscultation bilaterally, no wheezing, no crackles. Normal respiratory effort. No accessory muscle use.  Cardiovascular: Regular rate and rhythm, no murmurs / rubs / gallops. No extremity edema. 2+ pedal pulses. No carotid bruits.  Abdomen: no tenderness, no masses palpated. No hepatosplenomegaly. Bowel sounds positive.  Musculoskeletal: no clubbing / cyanosis. No joint deformity upper and lower extremities. Good ROM, no contractures. Normal muscle tone.  Skin: no rashes, lesions, ulcers. No induration Neurologic: CN 2-12 grossly intact. Sensation intact, DTR normal. Strength 5/5 in all 4.  Psychiatric: Normal judgment and insight. Alert and oriented x 3. Normal mood.    Body mass index is 24.21 kg/m.      Labs on Admission: I have personally reviewed following labs and imaging studies  CBC: Recent Labs  Lab 05/26/24 1929 05/27/24 0957  WBC 7.2 8.7  HGB 16.1 15.7  HCT 43.4 43.2  MCV 85.9 87.6  PLT 518* 450*   Basic Metabolic Panel: Recent Labs  Lab 05/26/24 1929 05/26/24 2211 05/27/24 0957  NA 132* 137 145  K 5.6* 5.7* 4.0  CL 91* 97* 107  CO2 24 24 29   GLUCOSE 1,087* 761* 242*  BUN 36* 32* 22*  CREATININE 1.59* 1.42* 1.16  CALCIUM  11.6* 11.4* 10.6*   GFR: Estimated Creatinine Clearance: 63.4 mL/min (by C-G formula based on SCr of 1.16 mg/dL). Liver Function Tests: No results for input(s): AST, ALT, ALKPHOS, BILITOT, PROT, ALBUMIN in the last 168 hours. No results for input(s): LIPASE, AMYLASE in the last 168 hours. No results for input(s): AMMONIA in the last 168 hours. Coagulation Profile: No results for  input(s): INR, PROTIME in the last 168 hours. Cardiac Enzymes: Recent Labs  Lab 05/26/24 1929  CKTOTAL 200   BNP (last 3 results) No results for input(s): PROBNP in the last 8760 hours. HbA1C: Recent Labs    05/27/24 0957  HGBA1C 12.5*   CBG: Recent Labs  Lab 05/27/24 0746 05/27/24 0905 05/27/24 1017 05/27/24 1116 05/27/24 1239  GLUCAP 229* 246* 229* 208* 319*   Lipid Profile: Recent Labs    05/27/24 0957  CHOL 267*  HDL 53  LDLCALC 196*  TRIG 89  CHOLHDL 5.0   Thyroid Function Tests: No results for input(s): TSH, T4TOTAL, FREET4, T3FREE, THYROIDAB in the last 72 hours. Anemia Panel: No results for input(s): VITAMINB12, FOLATE, FERRITIN, TIBC, IRON, RETICCTPCT in the last 72 hours. Urine analysis:    Component Value Date/Time   COLORURINE YELLOW 05/26/2024 1929   APPEARANCEUR CLEAR 05/26/2024 1929   LABSPEC <=1.005 05/26/2024 1929   PHURINE 5.5 05/26/2024 1929   GLUCOSEU >=500 (A) 05/26/2024 1929   HGBUR NEGATIVE 05/26/2024 1929   BILIRUBINUR NEGATIVE 05/26/2024 1929   KETONESUR NEGATIVE 05/26/2024 1929   PROTEINUR NEGATIVE  05/26/2024 1929   NITRITE NEGATIVE 05/26/2024 1929   LEUKOCYTESUR NEGATIVE 05/26/2024 1929   Sepsis Labs: !!!!!!!!!!!!!!!!!!!!!!!!!!!!!!!!!!!!!!!!!!!! @LABRCNTIP (procalcitonin:4,lacticidven:4) )No results found for this or any previous visit (from the past 240 hours).   Radiological Exams on Admission: No results found.  Nutritional status  All images have been reviewed by me personally.  EKG: Independently reviewed.   Assessment/Plan   Hyperglycemia/HHNK Uncontrolled diabetes mellitus type 2, insulin -dependent - Upon admission noted to have blood glucose greater than 1000.  Normal anion gap.  Blood sugars have improved on Endo tool.  Will transition to subcu insulin  including long-acting, Premeal and sliding scale.  Patient is interested in going home on insulin  and libre.  Will try and make  arrangements for this.  Acute kidney injury - Baseline creatinine 1.0, admission creatinine 1.42.  Improved with IV fluids  Hyperkalemia - Resolved  Essential hypertension - Started losartan  as renal fx has improved - IV as needed  Hyperlipidemia - LDL 196, will start statin    DVT prophylaxis: Lovenox  Code Status: Full code Family Communication:  Consults called: Diabetic coordinator Admission status admit to progressive    Time Spent: 65 minutes.  >50% of the time was devoted to discussing the patients care, assessment, plan and disposition with other care givers along with counseling the patient about the risks and benefits of treatment.    Burgess JAYSON Dare MD [facility]  If 7PM-7AM, please contact night-coverage   05/27/2024, 1:06 PM

## 2024-05-27 NOTE — ED Notes (Signed)
 [facility] is aware that [facility] is in route with pt.

## 2024-05-27 NOTE — Inpatient Diabetes Management (Addendum)
 Inpatient Diabetes Program Recommendations  AACE/ADA: New Consensus Statement on Inpatient Glycemic Control (2015)  Target Ranges:  Prepandial:   less than 140 mg/dL      Peak postprandial:   less than 180 mg/dL (1-2 hours)      Critically ill patients:  140 - 180 mg/dL   Lab Results  Component Value Date   GLUCAP 246 (H) 05/27/2024    Review of Glycemic Control  Latest Reference Range & Units 05/27/24 05:50 05/27/24 06:49 05/27/24 07:46 05/27/24 09:05  Glucose-Capillary 70 - 99 mg/dL 828 (H) 781 (H) 770 (H) 246 (H)  (H): Data is abnormally high Diabetes history: Type 2 DM Outpatient Diabetes medications: Metformin  500 mg BID (NT), Ozempic qwk (NT) Current orders for Inpatient glycemic control: IV insulin   A1C pending  Inpatient Diabetes Program Recommendations:    When ready to transition consider: - Lantus  15 units two hours prior to discontinuation then every day to follow -Novolog  3 units TID (Assuming patient is consuming >50% of meals) -Novolog  0-9 units TID & HS  Spoke with patient regarding outpatient diabetes management.  Reviewed patient's current A1c of 12.6%. Explained what a A1c is and what it measures. Also reviewed goal A1c with patient, importance of good glucose control @ home, and blood sugar goals. Reviewed patho of DM, need for improvement to glycemic control, hyper vs hypoglycemia, interventions, vascular changes and commorbidities. Patient does not like pricking finger to assess blood sugars. Reviewed Freestyle libre with patient, application, benefits, risks, and how to obtain additional sensors. Applied to upper right arm.  Admits to large amounts of sugary beverages. Reviewed alternatives, protein and plate method. Dietitian consult, outpatient education and [phone] added. Educated patient and spouse on insulin  pen use at home. Reviewed contents of insulin  flexpen starter kit. Reviewed all steps of insulin  pen including attachment of needle, 2-unit air shot,  dialing up dose, giving injection, removing needle, disposal of sharps, storage of unused insulin , disposal of insulin  etc. Patient able to provide successful return demonstration. Also reviewed troubleshooting with insulin  pen. MD to give patient Rxs for insulin  pens and insulin  pen needles.    Thanks, Tinnie Minus, MSN, RNC-OB Diabetes Coordinator [phone] (8a-5p)

## 2024-05-27 NOTE — Progress Notes (Signed)
 Per charge RN, patient is being admitted AP shift per Dr. Marcene.   Patient remains on insulin  drip and continuous fluids see MAR.

## 2024-05-27 NOTE — Plan of Care (Signed)
  Problem: Education: Goal: Knowledge of General Education information will improve Description: Including pain rating scale, medication(s)/side effects and non-pharmacologic comfort measures Outcome: Progressing   Problem: Health Behavior/Discharge Planning: Goal: Ability to manage health-related needs will improve Outcome: Not Met (add Reason) Note: On-going    Problem: Clinical Measurements: Goal: Ability to maintain clinical measurements within normal limits will improve Outcome: Not Met (add Reason) Note: On-going  Goal: Diagnostic test results will improve Outcome: Not Met (add Reason) Note: On-going    Problem: Nutrition: Goal: Adequate nutrition will be maintained Outcome: Not Met (add Reason) Note: On-going

## 2024-05-28 ENCOUNTER — Other Ambulatory Visit (HOSPITAL_COMMUNITY): Payer: Self-pay

## 2024-05-28 DIAGNOSIS — R739 Hyperglycemia, unspecified: Secondary | ICD-10-CM | POA: Diagnosis not present

## 2024-05-28 LAB — BASIC METABOLIC PANEL WITH GFR
Anion gap: 10 (ref 5–15)
BUN: 13 mg/dL (ref 6–20)
CO2: 24 mmol/L (ref 22–32)
Calcium: 9.2 mg/dL (ref 8.9–10.3)
Chloride: 104 mmol/L (ref 98–111)
Creatinine, Ser: 1.06 mg/dL (ref 0.61–1.24)
GFR, Estimated: >60 mL/min (ref >60)
Glucose, Bld: 175 mg/dL — ABNORMAL HIGH (ref 70–99)
Potassium: 3.6 mmol/L (ref 3.5–5.1)
Sodium: 138 mmol/L (ref 135–145)

## 2024-05-28 LAB — MAGNESIUM: Magnesium: 1.6 mg/dL — ABNORMAL LOW (ref 1.7–2.4)

## 2024-05-28 LAB — GLUCOSE, CAPILLARY
Glucose-Capillary: 229 mg/dL — ABNORMAL HIGH (ref 70–99)
Glucose-Capillary: 375 mg/dL — ABNORMAL HIGH (ref 70–99)

## 2024-05-28 LAB — PHOSPHORUS: Phosphorus: 2.9 mg/dL (ref 2.5–4.6)

## 2024-05-28 MED ORDER — LANCET DEVICE MISC
1.0000 | Freq: Three times a day (TID) | 0 refills | Status: AC
  Filled 2024-05-28: qty 1, fill #0

## 2024-05-28 MED ORDER — INSULIN GLARGINE-YFGN 100 UNIT/ML ~~LOC~~ SOPN
20.0000 [IU] | PEN_INJECTOR | Freq: Every day | SUBCUTANEOUS | 0 refills | Status: AC
  Filled 2024-05-28: qty 6, 30d supply, fill #0

## 2024-05-28 MED ORDER — MAGNESIUM SULFATE 2 GM/50ML IV SOLN
2.0000 g | Freq: Once | INTRAVENOUS | Status: AC
  Administered 2024-05-28: 2 g via INTRAVENOUS
  Filled 2024-05-28: qty 50

## 2024-05-28 MED ORDER — INSULIN GLARGINE 100 UNIT/ML ~~LOC~~ SOLN
20.0000 [IU] | Freq: Every day | SUBCUTANEOUS | Status: DC
  Administered 2024-05-28: 20 [IU] via SUBCUTANEOUS
  Filled 2024-05-28 (×2): qty 0.2

## 2024-05-28 MED ORDER — MAGNESIUM OXIDE -MG SUPPLEMENT 400 (240 MG) MG PO TABS
800.0000 mg | ORAL_TABLET | Freq: Once | ORAL | Status: AC
  Administered 2024-05-28: 800 mg via ORAL
  Filled 2024-05-28: qty 2

## 2024-05-28 MED ORDER — BLOOD GLUCOSE MONITOR SYSTEM W/DEVICE KIT
1.0000 | PACK | Freq: Three times a day (TID) | 0 refills | Status: AC
  Filled 2024-05-28: qty 1, 30d supply, fill #0

## 2024-05-28 MED ORDER — ATORVASTATIN CALCIUM 40 MG PO TABS
40.0000 mg | ORAL_TABLET | Freq: Every day | ORAL | 0 refills | Status: AC
  Filled 2024-05-28: qty 90, 90d supply, fill #0

## 2024-05-28 MED ORDER — POTASSIUM CHLORIDE CRYS ER 20 MEQ PO TBCR
40.0000 meq | EXTENDED_RELEASE_TABLET | Freq: Once | ORAL | Status: AC
  Administered 2024-05-28: 40 meq via ORAL
  Filled 2024-05-28: qty 2

## 2024-05-28 MED ORDER — INSULIN LISPRO (1 UNIT DIAL) 100 UNIT/ML (KWIKPEN)
3.0000 [IU] | PEN_INJECTOR | Freq: Three times a day (TID) | SUBCUTANEOUS | 0 refills | Status: AC
  Filled 2024-05-28: qty 15, 35d supply, fill #0

## 2024-05-28 MED ORDER — LOSARTAN POTASSIUM 50 MG PO TABS
50.0000 mg | ORAL_TABLET | Freq: Every day | ORAL | 0 refills | Status: AC
  Filled 2024-05-28: qty 90, 90d supply, fill #0

## 2024-05-28 MED ORDER — ACCU-CHEK GUIDE TEST VI STRP
ORAL_STRIP | 0 refills | Status: AC
  Filled 2024-05-28: qty 100, 30d supply, fill #0

## 2024-05-28 MED ORDER — ACCU-CHEK SOFTCLIX LANCETS MISC
1.0000 | Freq: Three times a day (TID) | 0 refills | Status: AC
  Filled 2024-05-28: qty 100, 30d supply, fill #0

## 2024-05-28 MED ORDER — EMBECTA PEN NEEDLE NANO 2 GEN 32G X 4 MM MISC
1.0000 | Freq: Three times a day (TID) | 3 refills | Status: AC
  Filled 2024-05-28: qty 100, 30d supply, fill #0

## 2024-05-28 NOTE — Hospital Course (Addendum)
 Brief Narrative:   58 y.o. male with medical history significant of insulin -dependent DM 2, HTN, HLD comes to the hospital for evaluation of hyperglycemia.  Patient reports that he takes metformin  and Ozempic at home but recently stopped taking it especially because the way Ozempic makes him feel.  He was admitted to the hospital for [facility] and AKI requiring Endo tool and IV fluids.  He was also started on losartan  and statin due to uncontrolled hypertension and hyperlipidemia.  Patient was eventually transition to subcu insulin . Medically stable for discharge today  Assessment & Plan:  Hyperglycemia/HHNK Uncontrolled diabetes mellitus type 2, insulin -dependent - Upon admission noted to have blood glucose greater than 1000.  Anion gap normal.  Initially initiated Endo tool now transition to subcu insulin .  Adjust long-acting insulin  as necessary.  We will give him all the prescriptions today.  He [facility] team. -He has been given libre for continuous glucose monitoring.  I have confirmed that the reading works on his phone -A1c 12.5   Acute kidney injury, resolved - Baseline creatinine 1.0, admission creatinine 1.42.  Improved with IV fluids   Hyperkalemia - Resolved  Hypomagnesemia - As needed repletion   Essential hypertension - Started losartan  as renal fx has improved   Hyperlipidemia - LDL 196, started Lipitor     DVT prophylaxis: Lovenox     Code Status: Full Code Family Communication:   Discharge today   PT Follow up Recs:   Subjective: Feeling well no complaints.   Examination:  General exam: Appears calm and comfortable  Respiratory system: Clear to auscultation. Respiratory effort normal. Cardiovascular system: S1 & S2 heard, RRR. No JVD, murmurs, rubs, gallops or clicks. No pedal edema. Gastrointestinal system: Abdomen is nondistended, soft and nontender. No organomegaly or  masses felt. Normal bowel sounds heard. Central nervous system: Alert and oriented. No focal neurological deficits. Extremities: Symmetric 5 x 5 power. Skin: No rashes, lesions or ulcers Psychiatry: Judgement and insight appear normal. Mood & affect appropriate.

## 2024-05-28 NOTE — Plan of Care (Signed)
  Problem: Education: Goal: Knowledge of General Education information will improve Description: Including pain rating scale, medication(s)/side effects and non-pharmacologic comfort measures Outcome: Progressing   Problem: Health Behavior/Discharge Planning: Goal: Ability to manage health-related needs will improve Outcome: Progressing   Problem: Clinical Measurements: Goal: Diagnostic test results will improve Outcome: Progressing   Problem: Nutrition: Goal: Adequate nutrition will be maintained Outcome: Progressing   Problem: Pain Managment: Goal: General experience of comfort will improve and/or be controlled Outcome: Progressing   Problem: Education: Goal: Ability to describe self-care measures that may prevent or decrease complications (Diabetes Survival Skills Education) will improve Outcome: Progressing

## 2024-05-28 NOTE — Discharge Summary (Signed)
 Physician Discharge Summary  Tyler Wallace FMW:[id number] DOB: 1966-02-01 DOA: 05/26/2024  PCP: [facility]  Admit date: 05/26/2024 Discharge date: 05/28/2024  Admitted From: Home Disposition: Home  Recommendations for Outpatient Follow-up:  Outpatient follow-up with PCP at the [facility] has been arranged by our TOC for tomorrow Discontinue metformin  and Ozempic Lantus  20 units twice daily, NovoLog  3 units Premeal and sliding scale has been prescribed He would benefit from endocrinology referral Herlene has been given  \ Discharge Condition: Stable CODE STATUS: Full code Diet recommendation: Diabetic  Brief/Interim Summary: Brief Narrative:   58 y.o. male with medical history significant of insulin -dependent DM 2, HTN, HLD comes to the hospital for evaluation of hyperglycemia.  Patient reports that he takes metformin  and Ozempic at home but recently stopped taking it especially because the way Ozempic makes him feel.  He was admitted to the hospital for [facility] and AKI requiring Endo tool and IV fluids.  He was also started on losartan  and statin due to uncontrolled hypertension and hyperlipidemia.  Patient was eventually transition to subcu insulin . Medically stable for discharge today  Assessment & Plan:  Hyperglycemia/HHNK Uncontrolled diabetes mellitus type 2, insulin -dependent - Upon admission noted to have blood glucose greater than 1000.  Anion gap normal.  Initially initiated Endo tool now transition to subcu insulin .  Adjust long-acting insulin  as necessary.  We will give him all the prescriptions today.  He [facility] which has been arranged by our [facility] team. -He has been given libre for continuous glucose monitoring.  I have confirmed that the reading works on his phone -A1c 12.5   Acute kidney injury, resolved - Baseline creatinine 1.0, admission creatinine 1.42.  Improved with IV fluids   Hyperkalemia -  Resolved  Hypomagnesemia - As needed repletion   Essential hypertension - Started losartan  as renal fx has improved   Hyperlipidemia - LDL 196, started Lipitor     DVT prophylaxis: Lovenox     Code Status: Full Code Family Communication:   Discharge today   PT Follow up Recs:   Subjective: Feeling well no complaints.   Examination:  General exam: Appears calm and comfortable  Respiratory system: Clear to auscultation. Respiratory effort normal. Cardiovascular system: S1 & S2 heard, RRR. No JVD, murmurs, rubs, gallops or clicks. No pedal edema. Gastrointestinal system: Abdomen is nondistended, soft and nontender. No organomegaly or masses felt. Normal bowel sounds heard. Central nervous system: Alert and oriented. No focal neurological deficits. Extremities: Symmetric 5 x 5 power. Skin: No rashes, lesions or ulcers Psychiatry: Judgement and insight appear normal. Mood & affect appropriate.    Discharge Diagnoses:  Principal Problem:   Hyperglycemia      Discharge Exam: Vitals:   05/28/24 0400 05/28/24 0929  BP: 125/81 (!) 150/91  Pulse: 84 88  Resp: 17 18  Temp: 98.2 F (36.8 C) 98.2 F (36.8 C)  SpO2: 99% 100%   Vitals:   05/27/24 1956 05/28/24 0025 05/28/24 0400 05/28/24 0929  BP: (!) 143/88 119/83 125/81 (!) 150/91  Pulse: 97 83 84 88  Resp: 16 18 17 18   Temp: 97.6 F (36.4 C) 98 F (36.7 C) 98.2 F (36.8 C) 98.2 F (36.8 C)  TempSrc: Oral Axillary Axillary Oral  SpO2: 97% 99% 99% 100%  Weight:   63.2 kg   Height:          Discharge Instructions  Discharge Instructions     AMB Referral VBCI Care Management   Complete by:  As directed    New to insulin . Cgm,   Expected date of contact: Emergent - 3 Days   Service: Pharmacy   Pharmacy Service For: Disease Management   Disease states to manage: Diabetes   Ambulatory referral to Nutrition and Diabetic Education   Complete by: As directed       Allergies as of 05/28/2024   No Known  Allergies      Medication List     STOP taking these medications    amLODipine  10 MG tablet Commonly known as: NORVASC    celecoxib 200 MG capsule Commonly known as: CELEBREX   lisinopril  40 MG tablet Commonly known as: ZESTRIL    metFORMIN  500 MG tablet Commonly known as: GLUCOPHAGE    Semaglutide(0.25 or 0.5MG /DOS) 2 MG/3ML Sopn   sildenafil 50 MG tablet Commonly known as: VIAGRA       TAKE these medications    acetaminophen  500 MG tablet Commonly known as: TYLENOL  Take 500 mg by mouth every 6 (six) hours as needed for mild pain (pain score 1-3) or moderate pain (pain score 4-6).   atorvastatin  40 MG tablet Commonly known as: LIPITOR Take 1 tablet (40 mg total) by mouth daily. What changed:  medication strength how much to take when to take this   buPROPion 300 MG 24 hr tablet Commonly known as: WELLBUTRIN XL Take 300 mg by mouth daily.   gabapentin 300 MG capsule Commonly known as: NEURONTIN Take 300 mg by mouth 3 (three) times daily as needed (pain).   insulin  glargine-yfgn 100 UNIT/ML Pen Commonly known as: SEMGLEE  Inject 20 Units into the skin daily.   insulin  lispro 100 UNIT/ML KwikPen Commonly known as: HumaLOG  KwikPen Inject 3 Units into the skin 3 (three) times daily with meals. If eating and Blood Glucose (BG) 80 or higher inject 3 units for meal coverage and add correction dose per scale. If not eating, correction dose only. BG <150= 0 unit; BG 150-200= 1 unit; BG 201-250= 3 unit; BG 251-300= 5 unit; BG 301-350= 7 unit; BG 351-400= 9 unit; BG >400= 11 unit and Call Primary Care.   losartan  50 MG tablet Commonly known as: COZAAR  Take 1 tablet (50 mg total) by mouth daily.   sertraline 100 MG tablet Commonly known as: ZOLOFT Take 100 mg by mouth daily.        Follow-up Information     Dr. Beverley Follow up on 05/29/2024.   Why: @ 3:30 pm for hospital follow up. Please bring a copy of the discharge summary. Contact  information: [facility] Address: [address], [place], [place] [address] Phone: [phone]               No Known Allergies  You were cared for by a hospitalist during your hospital stay. If you have any questions about your discharge medications or the care you received while you were in the hospital after you are discharged, you can call the unit and asked to speak with the hospitalist on call if the hospitalist that took care of you is not available. Once you are discharged, your primary care physician will handle any further medical issues. Please note that no refills for any discharge medications will be authorized once you are discharged, as it is imperative that you return to your primary care physician (or establish a relationship with a primary care physician if you do not have one) for your aftercare needs so that they can reassess your need for medications and monitor  your lab values.  You were cared for by a hospitalist during your hospital stay. If you have any questions about your discharge medications or the care you received while you were in the hospital after you are discharged, you can call the unit and asked to speak with the hospitalist on call if the hospitalist that took care of you is not available. Once you are discharged, your primary care physician will handle any further medical issues. Please note that NO REFILLS for any discharge medications will be authorized once you are discharged, as it is imperative that you return to your primary care physician (or establish a relationship with a primary care physician if you do not have one) for your aftercare needs so that they can reassess your need for medications and monitor your lab values.  Please request your Prim.MD to go over all Hospital Tests and Procedure/Radiological results at the follow up, please get all Hospital records sent to your Prim MD by signing hospital release  before you go home.  Get CBC, CMP, 2 view Chest X ray checked  by Primary MD during your next visit or SNF MD in 5-7 days ( we routinely change or add medications that can affect your baseline labs and fluid status, therefore we recommend that you get the mentioned basic workup next visit with your PCP, your PCP may decide not to get them or add new tests based on their clinical decision)  On your next visit with your primary care physician please Get Medicines reviewed and adjusted.  If you experience worsening of your admission symptoms, develop shortness of breath, life threatening emergency, suicidal or homicidal thoughts you must seek medical attention immediately by calling 911 or calling your MD immediately  if symptoms less severe.  You Must read complete instructions/literature along with all the possible adverse reactions/side effects for all the Medicines you take and that have been prescribed to you. Take any new Medicines after you have completely understood and accpet all the possible adverse reactions/side effects.   Do not drive, operate heavy machinery, perform activities at heights, swimming or participation in water activities or provide baby sitting services if your were admitted for syncope or siezures until you have seen by Primary MD or a Neurologist and advised to do so again.  Do not drive when taking Pain medications.   Procedures/Studies: CT ABDOMEN PELVIS W CONTRAST Result Date: 05/11/2024 CLINICAL DATA:  Pancreatitis, nausea EXAM: CT ABDOMEN AND PELVIS WITH CONTRAST TECHNIQUE: Multidetector CT imaging of the abdomen and pelvis was performed using the standard protocol following bolus administration of intravenous contrast. RADIATION DOSE REDUCTION: This exam was performed according to the departmental dose-optimization program which includes automated exposure control, adjustment of the mA and/or kV according to patient size and/or use of iterative reconstruction  technique. CONTRAST:  80mL OMNIPAQUE  IOHEXOL  300 MG/ML  SOLN COMPARISON:  None Available. FINDINGS: Lower chest: No acute abnormality. Hepatobiliary: No focal liver abnormality is seen. No gallstones, gallbladder wall thickening, or biliary dilatation. Pancreas: Pancreatic duct is slightly prominent measuring up to 4 mm. No suspicious finding to suggest pancreatic mass, abnormal enhancement the or peripancreatic fluid collection. A partial web versus thin linear density within the pancreatic duct at the uncinate process (2/29). Spleen: Normal in size without focal abnormality. Adrenals/Urinary Tract: . Thickening of left adrenal gland without discrete nodule Right adrenal is normal. Left kidney lower pole nonobstructing nephrolithiasis measuring 4 mm. Fullness of bilateral renal collecting systems left greater than right. Few subcentimeter simple  renal cortical cysts which does not require imaging follow-up. Stomach/Bowel: Stomach is within normal limits. Appendix appears normal. No evidence of bowel wall thickening, distention, or inflammatory changes. Vascular/Lymphatic: No significant vascular findings are present. Few pericentimeter mesenteric root lymph nodes measuring up to 1.1 cm (7/38 Reproductive: Prostatomegaly. Midline calcified density in prostate along the prostatic urethra may represent a parenchymal calcification versus a ureteral stone measuring 3.7 x 7 mm (2/74) Other: Small inguinal hernias left greater than right. Mild lateral small fat containing inguinal hernias. Small fat containing left umbilical/paraumbilical hernia with a defect measuring 1.2 cm. Musculoskeletal: L4 Schmorl's node. Degenerative changes of the spine. IMPRESSION: Prominent pancreatic duct without definite mass lesion or peripancreatic fluid collection. A partial web versus linear intraluminal artifact from vascular structure along the pancreatic duct at the uncinate process , indeterminate. Recommend dedicated MRCP for further  assessment if clinically warranted. Mesenteric root prominent lymph nodes, likely reactive Left kidney nonobstructive nephrolithiasis. Mild fullness of the bilateral extrarenal pelvises left greater than right (UPJ stenosis appearance). Additional findings as above. Electronically Signed   By: Megan  Zare M.D.   On: 05/11/2024 14:12     The results of significant diagnostics from this hospitalization (including imaging, microbiology, ancillary and laboratory) are listed below for reference.     Microbiology: No results found for this or any previous visit (from the past 240 hours).   Labs: BNP (last 3 results) No results for input(s): BNP in the last 8760 hours. Basic Metabolic Panel: Recent Labs  Lab 05/26/24 1929 05/26/24 2211 05/27/24 0957 05/28/24 0455  NA 132* 137 145 138  K 5.6* 5.7* 4.0 3.6  CL 91* 97* 107 104  CO2 24 24 29 24   GLUCOSE 1,087* 761* 242* 175*  BUN 36* 32* 22* 13  CREATININE 1.59* 1.42* 1.16 1.06  CALCIUM  11.6* 11.4* 10.6* 9.2  MG  --   --   --  1.6*  PHOS  --   --   --  2.9   Liver Function Tests: No results for input(s): AST, ALT, ALKPHOS, BILITOT, PROT, ALBUMIN in the last 168 hours. No results for input(s): LIPASE, AMYLASE in the last 168 hours. No results for input(s): AMMONIA in the last 168 hours. CBC: Recent Labs  Lab 05/26/24 1929 05/27/24 0957  WBC 7.2 8.7  HGB 16.1 15.7  HCT 43.4 43.2  MCV 85.9 87.6  PLT 518* 450*   Cardiac Enzymes: Recent Labs  Lab 05/26/24 1929  CKTOTAL 200   BNP: Invalid input(s): POCBNP CBG: Recent Labs  Lab 05/27/24 1533 05/27/24 1611 05/27/24 1802 05/27/24 2147 05/28/24 0846  GLUCAP 156* 157* 277* 287* 229*   D-Dimer No results for input(s): DDIMER in the last 72 hours. Hgb A1c Recent Labs    05/27/24 0957  HGBA1C 12.5*   Lipid Profile Recent Labs    05/27/24 0957  CHOL 267*  HDL 53  LDLCALC 196*  TRIG 89  CHOLHDL 5.0   Thyroid function studies No results  for input(s): TSH, T4TOTAL, T3FREE, THYROIDAB in the last 72 hours.  Invalid input(s): FREET3 Anemia work up No results for input(s): VITAMINB12, FOLATE, FERRITIN, TIBC, IRON, RETICCTPCT in the last 72 hours. Urinalysis    Component Value Date/Time   COLORURINE YELLOW 05/26/2024 1929   APPEARANCEUR CLEAR 05/26/2024 1929   LABSPEC <=1.005 05/26/2024 1929   PHURINE 5.5 05/26/2024 1929   GLUCOSEU >=500 (A) 05/26/2024 1929   HGBUR NEGATIVE 05/26/2024 1929   BILIRUBINUR NEGATIVE 05/26/2024 1929   KETONESUR NEGATIVE 05/26/2024 1929  PROTEINUR NEGATIVE 05/26/2024 1929   NITRITE NEGATIVE 05/26/2024 1929   LEUKOCYTESUR NEGATIVE 05/26/2024 1929   Sepsis Labs Recent Labs  Lab 05/26/24 1929 05/27/24 0957  WBC 7.2 8.7   Microbiology No results found for this or any previous visit (from the past 240 hours).   Time coordinating discharge:  I have spent 35 minutes face to face with the patient and on the ward discussing the patients care, assessment, plan and disposition with other care givers. >50% of the time was devoted counseling the patient about the risks and benefits of treatment/Discharge disposition and coordinating care.   SIGNED:   Burgess JAYSON Dare, MD  [facility] 05/28/2024, 10:09 AM   If 7PM-7AM, please contact night-coverage

## 2024-05-28 NOTE — Progress Notes (Signed)
 Discharge Summary: DC order noted per MD. DC RN at bedside. Med details completed. AVS printed, set at the bedside with primary RN as patient is pending IV Mg administration. TOC meds pending pickup.

## 2024-05-29 ENCOUNTER — Telehealth: Payer: Self-pay | Admitting: *Deleted

## 2024-05-29 NOTE — Progress Notes (Signed)
 Care Guide Pharmacy Note  05/29/2024 Name: Tyler Wallace. MRN: [id number] DOB: 16-Jul-1966  Referred By: [facility] Reason for referral: Call Attempt #1 (Outreach to schedule referral with pharmacist )   Tyler Wallace. is a 58 y.o. year old male who is a primary care [facility]  Tyler Wallace. was referred to the pharmacist for assistance related to: HTN  An unsuccessful telephone outreach was attempted today to contact the patient who was referred to the pharmacy team for assistance with medication management. Additional attempts will be made to contact the patient.  Tyler Wallace, CMA [place]  [facility], [facility] Guide Direct Dial : [phone]  Fax: [phone] Website: [email or web address]

## 2024-06-01 NOTE — Progress Notes (Signed)
 Care Guide Pharmacy Note  06/01/2024 Name: Tyler Wallace. MRN: [id number] DOB: [birth year]  Referred By: [facility] Reason for referral: Call Attempt #1 (Outreach to schedule referral with pharmacist )   [name] is a 58 y.o. year old male who is a [facility]  [name] was referred to the pharmacist for assistance related to: DMII  Successful contact was made with the patient to discuss pharmacy services including being ready for the pharmacist to call at least 5 minutes before the scheduled appointment time and to have medication bottles and any blood pressure readings ready for review. The patient agreed to meet with the pharmacist via telephone visit on 06/04/2024  Thedford Franks, CMA [place]  [facility], [facility] Guide Direct Dial : [phone]  Fax: [phone] Website: [email or web address]

## 2024-06-04 ENCOUNTER — Other Ambulatory Visit: Payer: Self-pay

## 2024-06-04 DIAGNOSIS — E119 Type 2 diabetes mellitus without complications: Secondary | ICD-10-CM

## 2024-06-04 NOTE — Patient Instructions (Addendum)
 Hello Tyler Wallace,  It was wonderful speaking with you today! Below are some key points to remember based on our conversation:  1. Use your blood sugar monitor which goes on your arm to alert you to highs and lows. If you're not sure whether it's accurate, the finger-prick blood sugar monitor is the most accurate and can help you confirm a high or low 2. Use quick sugars for correcting low blood sugar (less than 70), wait 15 minutes, then check your sugar again. If it's less than 70, have more quick sugar, and wait again. If it's above 70, have some protein to stabilize your blood sugar. 3. Try to always make your primary care appointments, and let them know when you keep having low blood sugar for them to help you manage it  If you have questions based on what we discussed, reach out to your primary care team who will follow-up with you on your questions. Be sure to be proactive in reaching out early, before a medical emergency occurs!  Sincerely,  Jon Piety, PharmD, MS, BCACP [facility] Pharmacist

## 2024-06-04 NOTE — Progress Notes (Signed)
 06/04/2024 Name: Tyler Wallace Tyler Wallace. MRN: [id number] DOB: 1965-09-23  Chief Complaint  Patient presents with   Diabetes    Tyler Wallace. is a 58 y.o. year old male who presented for a telephone visit.   They were referred to the pharmacist by the [facility] program for assistance in managing diabetes.   Subjective: Recent hospitalization from 09/09-09/11 for hyperglyecmia (Hyperosmolar Hyperglycemic Nonketotic State) and AKI. PMH significant for T2DM, HTN, and HLD. He was discharged on basal/bolus insulin  and a CGM. Presented to ED earlier this year (on 08/25) for dizziness, thirst and increased urination. Discharged as non-overt DKA. Prior ED visits in 2024 and 2023 for similar issue.   States that he has had T2DM for ~11 years, often going for months without taking his medications. He is now motivated to make changes to manage his diabetes. He had a follow-up closely after his recent hospitalization, and continuous [employer] CGM supplies were ordered for him at that time. States he plans to go to a diabetes education class tomorrow, and that he has been doing well with his insulin  and CGM.   Has questions about what to do when his CGM and his glucose monitor report different values, up to 50 points different on some days. Describes a hypoglycemic episode where he had a BG of ~54, ate two pieces of cake, then saw his BG spike up to 214, which he then corrected with 5 units of Humalog .  Described that he frequently has has hypoglycemic episodes overnight, and is unsure what to do to prevent them. He believes he was told by his primary care team that he could adjust his long-acting insulin  to prevent these overnight lows.  Current DM regimen Insulin  glargine-yfgn (Semglee ) 20 units SQ daily Insulin  lispro (Humalog ) 3 units SQ TID. Correction scale provided in prescription is copied below: > BG <150= 0 unit > BG 150-200= 1 unit > BG 201-250= 3 units > BG 251-300= 5 units > BG  301-350= 7 units > BG 351-400= 9 units > BG >400= 11 units  Past DM medications Metformin  500mg  PO BID Ozempic 0.5mg  SQ weekly (could not tolerate) Insulin  regular (Novolin) Insulin  glargine (Lantus )  A1C history 05/27/24: 12.5% 10/25/20: 10.5% 12/21/19: 13.7% 06/12/19: 11.9%  Care Team: Primary Care Provider: [facility] ; Next Scheduled Visit: 08/06/24  Medication Access/Adherence  Current Pharmacy:  [facility] - [place], [place] - [address] [place] [address] Phone: [phone] Fax: [phone]  [facility] [address] [place] [address] Phone: [phone] Fax: [phone]   Objective:  Lab Results  Component Value Date   HGBA1C 12.5 (H) 05/27/2024    Lab Results  Component Value Date   CREATININE 1.06 05/28/2024   BUN 13 05/28/2024   NA 138 05/28/2024   K 3.6 05/28/2024   CL 104 05/28/2024   CO2 24 05/28/2024    Lab Results  Component Value Date   CHOL 267 (H) 05/27/2024   HDL 53 05/27/2024   LDLCALC 196 (H) 05/27/2024   TRIG 89 05/27/2024   CHOLHDL 5.0 05/27/2024    Medications Reviewed Today     Reviewed by Matthews Jon SAUNDERS on 06/04/24 at 1020  Med List Status: <None>   Medication Order Taking? Sig Documenting Provider Last Dose Status Informant  Accu-Chek Softclix Lancets lancets [id number] Yes Use as directed to check blood sugar three times daily. Caleen Burgess BROCKS, MD  Active  acetaminophen  (TYLENOL ) 500 MG tablet [id number]  Take 500 mg by mouth every 6 (six) hours as needed for mild pain (pain score 1-3) or moderate pain (pain score 4-6). [provider]  Active Self, Other, Pharmacy Records, Multiple Informants  atorvastatin  (LIPITOR) 40 MG tablet [id number]  Take 1 tablet (40 mg total) by mouth daily. Amin, Ankit C, MD  Active   Blood Glucose Monitoring Suppl (BLOOD GLUCOSE MONITOR SYSTEM) w/Device  KIT [id number] Yes Use to check blood glucose three times daily. Amin, Ankit C, MD  Active   buPROPion (WELLBUTRIN XL) 300 MG 24 hr tablet [id number]  Take 300 mg by mouth daily. [provider]  Active Self, Other, Pharmacy Records, Multiple Informants  gabapentin (NEURONTIN) 300 MG capsule [id number]  Take 300 mg by mouth 3 (three) times daily as needed (pain). [provider]  Active Self, Other, Pharmacy Records, Multiple Informants  glucose blood (ACCU-CHEK GUIDE TEST) test strip [id number] Yes Use as directed three times daily. Caleen Burgess BROCKS, MD  Active   insulin  glargine-yfgn (SEMGLEE ) 100 UNIT/ML Pen [id number] Yes Inject 20 Units into the skin daily. Caleen Burgess BROCKS, MD  Active   insulin  lispro (HUMALOG  KWIKPEN) 100 UNIT/ML KwikPen [id number] Yes Inject 3 Units into the skin 3 (three) times daily with meals. If eating and Blood Glucose (BG) 80 or higher inject 3 units for meal coverage and add correction dose per scale. If not eating, correction dose only. BG <150= 0 unit; BG 150-200= 1 unit; BG 201-250= 3 unit; BG 251-300= 5 unit; BG 301-350= 7 unit; BG 351-400= 9 unit; BG >400= 11 unit and Call Primary Care.  Patient taking differently: Inject 3 Units into the skin 3 (three) times daily with meals. If eating and Blood Glucose (BG) 80 or higher inject 3 units for meal coverage and add correction dose per scale. If not eating, correction dose only. BG <150= 0 unit; BG 150-200= 1 unit; BG 201-250= 3 unit; BG 251-300= 5 unit; BG 301-350= 7 unit; BG 351-400= 9 unit; BG >400= 11 unit and Call Primary Care.   Amin, Ankit C, MD  Active   Insulin  Pen Needle (EMBECTA PEN NEEDLE NANO 2 GEN) 32G X 4 MM MISC [id number] Yes Use 1 needle 3 (three) times daily. Caleen Burgess BROCKS, MD  Active   Lancet Device MISC [id number] Yes 1 each by Does not apply route 3 (three) times daily. May dispense any manufacturer covered by patient's insurance. Amin, Ankit C, MD  Active   losartan  (COZAAR ) 50 MG tablet [id number]   Take 1 tablet (50 mg total) by mouth daily. Caleen Burgess BROCKS, MD  Active   sertraline (ZOLOFT) 100 MG tablet [id number]  Take 100 mg by mouth daily. [provider]  Active Self, Other, Pharmacy Records, Multiple Informants  Med List Note Steffi Nian, CPhT 05/27/24 1005): [facility]              Assessment/Plan:   Diabetes: > Currently uncontrolled; goal A1c <7%. Cardiorenal risk reduction is opportunities for improvement..  > Advised him that blood glucose (SMBG) is most accurate in the moment compared to a CGM, but that CGM will alert him accurately to highs and lows that are upcoming or occurring > Educated him on the Rule of 15 for correcting low blood sugar, particularly protein-based foods which help keep blood sugar stable after a low > Advised him that regular follow-up and engagement with primary care team will produce the best long-term diabetes outcomes > Discussed how adjusting (  in his case, lowering) long-acting insulin  by 2 units every 2-3 days is one strategy to optimizing this insulin  to prevent overnight and early AM hypoglycemia > Advised that he should be proactive in reporting hypoglycemic episodes to his primary care team, which may prompt an earlier appointment for insulin  adjustment > Encouraged attendance at diabetes education class tomorrow and in future as he is able  Follow Up Plan: continue care at [facility].  Jon Piety, PharmD, MS, BCACP [facility] Pharmacist
